# Patient Record
Sex: Male | Born: 1985 | Race: White | Hispanic: No | Marital: Single | Smoking: Current every day smoker
Health system: Southern US, Community
[De-identification: ages and names within clinical notes are randomized; demographics above are authoritative.]

## PROBLEM LIST (undated history)

## (undated) DIAGNOSIS — I10 Essential (primary) hypertension: Secondary | ICD-10-CM

---
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
## 2011-09-19 ENCOUNTER — Emergency Department: Payer: Self-pay | Admitting: Unknown Physician Specialty

## 2011-09-19 LAB — CBC
HCT: 37.9 % — ABNORMAL LOW (ref 40.0–52.0)
HGB: 13 g/dL (ref 13.0–18.0)
MCH: 30.3 pg (ref 26.0–34.0)
MCV: 88 fL (ref 80–100)
Platelet: 233 10*3/uL (ref 150–440)
RBC: 4.3 10*6/uL — ABNORMAL LOW (ref 4.40–5.90)

## 2011-09-19 LAB — ETHANOL
Ethanol %: <0.003 % (ref 0.000–0.080)
Ethanol: <3 mg/dL

## 2011-09-19 LAB — COMPREHENSIVE METABOLIC PANEL
Albumin: 3.3 g/dL — ABNORMAL LOW (ref 3.4–5.0)
Bilirubin,Total: 0.3 mg/dL (ref 0.2–1.0)
Chloride: 109 mmol/L — ABNORMAL HIGH (ref 98–107)
EGFR (Non-African Amer.): >60
Glucose: 102 mg/dL — ABNORMAL HIGH (ref 65–99)
Osmolality: 284 (ref 275–301)
Potassium: 3.6 mmol/L (ref 3.5–5.1)
SGOT(AST): 28 U/L (ref 15–37)
SGPT (ALT): 25 U/L
Sodium: 142 mmol/L (ref 136–145)
Total Protein: 6 g/dL — ABNORMAL LOW (ref 6.4–8.2)

## 2011-09-20 LAB — URINALYSIS, COMPLETE
Bacteria: NONE SEEN
Bilirubin,UR: NEGATIVE
Blood: NEGATIVE
Glucose,UR: 50 mg/dL (ref 0–75)
Ketone: NEGATIVE
Leukocyte Esterase: NEGATIVE
RBC,UR: NONE SEEN /HPF (ref 0–5)
Specific Gravity: 1.011 (ref 1.003–1.030)
Squamous Epithelial: <1

## 2011-09-20 LAB — DRUG SCREEN, URINE
Barbiturates, Ur Screen: NEGATIVE (ref ?–200)
Benzodiazepine, Ur Scrn: NEGATIVE (ref ?–200)
Cannabinoid 50 Ng, Ur ~~LOC~~: POSITIVE (ref ?–50)
Methadone, Ur Screen: NEGATIVE (ref ?–300)

## 2011-09-28 ENCOUNTER — Emergency Department: Payer: Self-pay | Admitting: Emergency Medicine

## 2011-12-10 ENCOUNTER — Emergency Department: Payer: Self-pay | Admitting: Emergency Medicine

## 2012-03-12 ENCOUNTER — Emergency Department: Payer: Self-pay | Admitting: Emergency Medicine

## 2013-05-23 ENCOUNTER — Emergency Department: Payer: Self-pay | Admitting: Emergency Medicine
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
## 2013-09-02 ENCOUNTER — Emergency Department: Payer: Self-pay | Admitting: Emergency Medicine

## 2013-09-02 LAB — COMPREHENSIVE METABOLIC PANEL
ALBUMIN: 4.6 g/dL (ref 3.4–5.0)
Alkaline Phosphatase: 98 U/L
Anion Gap: 12 (ref 7–16)
BUN: 16 mg/dL (ref 7–18)
Bilirubin,Total: 1.5 mg/dL — ABNORMAL HIGH (ref 0.2–1.0)
Calcium, Total: 9.7 mg/dL (ref 8.5–10.1)
Chloride: 103 mmol/L (ref 98–107)
Co2: 22 mmol/L (ref 21–32)
Creatinine: 1.23 mg/dL (ref 0.60–1.30)
EGFR (Non-African Amer.): >60
GLUCOSE: 125 mg/dL — AB (ref 65–99)
Osmolality: 276 (ref 275–301)
Potassium: 3.1 mmol/L — ABNORMAL LOW (ref 3.5–5.1)
SGOT(AST): 29 U/L (ref 15–37)
SGPT (ALT): 22 U/L (ref 12–78)
Sodium: 137 mmol/L (ref 136–145)
TOTAL PROTEIN: 8.3 g/dL — AB (ref 6.4–8.2)

## 2013-09-02 LAB — CBC
HCT: 48.6 % (ref 40.0–52.0)
HGB: 16.7 g/dL (ref 13.0–18.0)
MCH: 29.8 pg (ref 26.0–34.0)
MCHC: 34.4 g/dL (ref 32.0–36.0)
MCV: 87 fL (ref 80–100)
Platelet: 226 10*3/uL (ref 150–440)
RBC: 5.6 10*6/uL (ref 4.40–5.90)
RDW: 13.3 % (ref 11.5–14.5)
WBC: 10 10*3/uL (ref 3.8–10.6)

## 2013-09-02 LAB — LIPASE, BLOOD: Lipase: 192 U/L (ref 73–393)

## 2013-11-01 ENCOUNTER — Emergency Department: Payer: Self-pay | Admitting: Emergency Medicine

## 2015-11-02 ENCOUNTER — Emergency Department: Payer: Self-pay

## 2015-11-02 ENCOUNTER — Encounter: Payer: Self-pay | Admitting: *Deleted

## 2015-11-02 ENCOUNTER — Emergency Department
Admission: EM | Admit: 2015-11-02 | Discharge: 2015-11-02 | Disposition: A | Discharge status: Home / Self Care | Payer: Self-pay | Attending: Emergency Medicine | Admitting: Emergency Medicine

## 2015-11-02 DIAGNOSIS — R112 Nausea with vomiting, unspecified: Secondary | ICD-10-CM | POA: Insufficient documentation

## 2015-11-02 DIAGNOSIS — R1111 Vomiting without nausea: Secondary | ICD-10-CM

## 2015-11-02 DIAGNOSIS — F17210 Nicotine dependence, cigarettes, uncomplicated: Secondary | ICD-10-CM | POA: Insufficient documentation

## 2015-11-02 LAB — CBC
HEMATOCRIT: 49.9 % (ref 40.0–52.0)
HEMOGLOBIN: 18 g/dL (ref 13.0–18.0)
MCH: 30.4 pg (ref 26.0–34.0)
MCHC: 36.1 g/dL — ABNORMAL HIGH (ref 32.0–36.0)
MCV: 84.1 fL (ref 80.0–100.0)
Platelets: 211 10*3/uL (ref 150–440)
RBC: 5.93 MIL/uL — AB (ref 4.40–5.90)
RDW: 12.8 % (ref 11.5–14.5)
WBC: 7 10*3/uL (ref 3.8–10.6)

## 2015-11-02 LAB — COMPREHENSIVE METABOLIC PANEL
ALK PHOS: 85 U/L (ref 38–126)
ALT: 18 U/L (ref 17–63)
AST: 20 U/L (ref 15–41)
Albumin: 5 g/dL (ref 3.5–5.0)
Anion gap: 15 (ref 5–15)
BILIRUBIN TOTAL: 2 mg/dL — AB (ref 0.3–1.2)
BUN: 22 mg/dL — ABNORMAL HIGH (ref 6–20)
CALCIUM: 9.6 mg/dL (ref 8.9–10.3)
CO2: 22 mmol/L (ref 22–32)
CREATININE: 0.93 mg/dL (ref 0.61–1.24)
Chloride: 95 mmol/L — ABNORMAL LOW (ref 101–111)
Glucose, Bld: 88 mg/dL (ref 65–99)
Potassium: 3.3 mmol/L — ABNORMAL LOW (ref 3.5–5.1)
SODIUM: 132 mmol/L — AB (ref 135–145)
Total Protein: 8.1 g/dL (ref 6.5–8.1)

## 2015-11-02 LAB — URINALYSIS COMPLETE WITH MICROSCOPIC (ARMC ONLY)
Bilirubin Urine: NEGATIVE
GLUCOSE, UA: NEGATIVE mg/dL
Hgb urine dipstick: NEGATIVE
Leukocytes, UA: NEGATIVE
Nitrite: NEGATIVE
Protein, ur: 100 mg/dL — AB
SPECIFIC GRAVITY, URINE: 1.028 (ref 1.005–1.030)
Squamous Epithelial / LPF: NONE SEEN
pH: 5 (ref 5.0–8.0)

## 2015-11-02 LAB — LIPASE, BLOOD: Lipase: 25 U/L (ref 11–51)

## 2015-11-02 MED ORDER — PROMETHAZINE HCL 25 MG PO TABS: 25.0000 mg | ORAL_TABLET | Freq: Four times a day (QID) | ORAL | 0 refills | Status: DC | PRN

## 2015-11-02 MED ORDER — POLYETHYLENE GLYCOL 3350 17 GM/SCOOP PO POWD: 17.0000 g | Freq: Every day | ORAL | 0 refills | Status: DC

## 2015-11-02 MED ORDER — KETOROLAC TROMETHAMINE 30 MG/ML IJ SOLN
30.0000 mg | Freq: Once | INTRAMUSCULAR | Status: AC
  Administered 2015-11-02: 30 mg via INTRAVENOUS
  Filled 2015-11-02: qty 1

## 2015-11-02 MED ORDER — ONDANSETRON 4 MG PO TBDP: 4.0000 mg | ORAL_TABLET | Freq: Three times a day (TID) | ORAL | 0 refills | Status: DC | PRN

## 2015-11-02 MED ORDER — SODIUM CHLORIDE 0.9 % IV BOLUS (SEPSIS)
1000.0000 mL | Freq: Once | INTRAVENOUS | Status: AC
  Administered 2015-11-02: 1000 mL via INTRAVENOUS

## 2015-11-02 MED ORDER — ONDANSETRON HCL 4 MG/2ML IJ SOLN
4.0000 mg | Freq: Once | INTRAMUSCULAR | Status: AC
  Administered 2015-11-02: 4 mg via INTRAVENOUS
  Filled 2015-11-02: qty 2

## 2015-11-02 NOTE — ED Triage Notes (Signed)
Pt reports nausea/vomitng since Tuesday, pt reports lower abdominal pain, pt denies fever

## 2015-11-02 NOTE — ED Notes (Signed)
Pt verbalized understanding of discharge instructions. NAD at this time. 

## 2015-11-02 NOTE — ED Provider Notes (Signed)
[facility] Emergency Department Provider Note  Time seen: 8:03 AM  I have reviewed the triage vital signs and the nursing notes.   HISTORY  Chief Complaint Nausea and Emesis    HPI Oscar Miranda is a 30 y.o. male with no past medical history who presents the emergency department with nausea, vomiting and left-sided abdominal discomfort. According to the patient for the past 5 days he has felt nauseated with occasional episodes of vomiting. States he has had constipation as well for for 5 days. Denies fever. States he has had episodes of nausea and vomiting in the past but never lasting this [place]. Describes his nausea is moderate, abdominal pain as mild located mostly in the epigastrium to left side. States he abdominal pain is worse when vomiting.  History reviewed. No pertinent past medical history.  There are no active problems to display for this patient.   History reviewed. No pertinent surgical history.  Prior to Admission medications   Not on File    No Known Allergies  No family history on file.  Social History Social History  Substance Use Topics  . Smoking status: Current Every Day Smoker    Packs/day: 0.50    Types: Cigarettes  . Smokeless tobacco: Never Used  . Alcohol use No    Review of Systems Constitutional: Negative for fever. Cardiovascular: Negative for chest pain. Respiratory: Negative for shortness of breath. Gastrointestinal: Epigastric to left-sided abdominal discomfort. Positive for nausea or vomiting. Positive for constipation. Genitourinary: Negative for dysuria. Musculoskeletal: Mild lower back pain. Neurological: Negative for headache 10-point ROS otherwise negative.  ____________________________________________   PHYSICAL EXAM:  VITAL SIGNS: ED Triage Vitals  Enc Vitals Group     BP 11/02/15 0758 (!) 169/115     Pulse Rate 11/02/15 0758 85     Resp 11/02/15 0758 18     Temp 11/02/15 0758 99.1 F (37.3  C)     Temp Source 11/02/15 0758 Oral     SpO2 11/02/15 0758 100 %     Weight 11/02/15 0753 125 lb (56.7 kg)     Height 11/02/15 0753 5\' 2"  (1.575 m)     Head Circumference --      Peak Flow --      Pain Score 11/02/15 0753 5     Pain Loc --      Pain Edu? --      Excl. in GC? --     Constitutional: Alert and oriented. Well appearing and in no distress. Eyes: Normal exam ENT   Head: Normocephalic and atraumatic.   Mouth/Throat: Mucous membranes are moist. Cardiovascular: Normal rate, regular rhythm. No murmur Respiratory: Normal respiratory effort without tachypnea nor retractions. Breath sounds are clear Gastrointestinal: Soft, mild epigastric tenderness palpation, mild left sided and left lower quadrant tenderness palpation. No rebound or guarding. No right-sided tenderness. Musculoskeletal: Nontender with normal range of motion in all extremities.  Neurologic:  Normal speech and language. No gross focal neurologic deficits are appreciated. Skin:  Skin is warm, dry and intact.  Psychiatric: Mood and affect are normal. Speech and behavior are normal.   ____________________________________________     RADIOLOGY  X-ray shows moderate stool Azucena CecilBurton, otherwise within normal limits.  ____________________________________________   INITIAL IMPRESSION / ASSESSMENT AND PLAN / ED COURSE  Pertinent labs & imaging results that were available during my care of the patient were reviewed by me and considered in my medical decision making (see chart for details).  The patient presents the  emergency department with abdominal discomfort nausea and vomiting. Overall the patient appears well, mild abdominal tenderness on exam. We will check labs, IV hydrate treat discomfort nausea while awaiting lab results. We will also obtain a three-way abdominal x-ray.  X-ray shows moderate stool Azucena Cecil, otherwise normal. Labs are largely within normal limits besides ketones and the patient's  urine. We'll discharge with MiraLAX for constipation, and Zofran as needed for nausea. The patient is agreeable plan. Has tolerated drink in the emergency department without issue.  ____________________________________________   FINAL CLINICAL IMPRESSION(S) / ED DIAGNOSES  Vomiting  constipation   Minna Antis, MD 11/02/15 1151

## 2015-11-02 NOTE — ED Notes (Signed)
Patient transported to X-ray 

## 2015-11-05 MED ORDER — MORPHINE SULFATE (PF) 4 MG/ML IV SOLN
INTRAVENOUS | Status: AC
  Filled 2015-11-05: qty 1

## 2016-01-23 ENCOUNTER — Encounter: Payer: Self-pay | Admitting: Emergency Medicine

## 2016-01-23 ENCOUNTER — Emergency Department
Admission: EM | Admit: 2016-01-23 | Discharge: 2016-01-23 | Disposition: A | Discharge status: Home / Self Care | Payer: Self-pay | Attending: Emergency Medicine | Admitting: Emergency Medicine

## 2016-01-23 DIAGNOSIS — Z79899 Other long term (current) drug therapy: Secondary | ICD-10-CM | POA: Insufficient documentation

## 2016-01-23 DIAGNOSIS — L739 Follicular disorder, unspecified: Secondary | ICD-10-CM | POA: Insufficient documentation

## 2016-01-23 DIAGNOSIS — F17210 Nicotine dependence, cigarettes, uncomplicated: Secondary | ICD-10-CM | POA: Insufficient documentation

## 2016-01-23 MED ORDER — CEPHALEXIN 500 MG PO CAPS: 500.0000 mg | ORAL_CAPSULE | Freq: Three times a day (TID) | ORAL | 0 refills | Status: DC

## 2016-01-23 NOTE — Discharge Instructions (Signed)
Take the antibiotics as directed. Keep the areas clean with surgical soap. Cover with OTC bacitracin antibiotic ointment. [facility]ck as needed.

## 2016-01-23 NOTE — ED Notes (Signed)
Multiple red and raised bumps to both areas of new tattoos. Right arm and right lower extremity. Pt got these tattoos approx 2 week ago. Denise pain. Pt alert and oriented X4, active, cooperative, pt in NAD. RR even and unlabored, color WNL.

## 2016-01-23 NOTE — ED Provider Notes (Signed)
Oscar Miranda ____________________________________________  Time seen: 2127  I have reviewed the triage vital signs and the nursing notes.  HISTORY  Chief Complaint  Rash  HPI Oscar Miranda is a 30 y.o. male sensitivity ED for evaluation of bumps on around his 2 recent tattoos. He describes that he received a tattoo to his chest but a week agoand that tattoo on his ankle about 2 days ago. Since that time he has had some small blisters and white has developed over the area. He has attempted to apply Neosporin to the areas without significant benefit. He denies any fevers, chills, sweats. He notes some small pustules to the tattoo on his ankle.  History reviewed. No pertinent past medical history.  There are no active problems to display for this patient.  History reviewed. No pertinent surgical history.  Prior to Admission medications   Medication Sig Start Date End Date Taking? Authorizing Provider  cephALEXin (KEFLEX) 500 MG capsule Take 1 capsule (500 mg total) by mouth 3 (three) times daily. 01/23/16   Posey Jasmin V Bacon Adison Reifsteck, PA-C  ondansetron (ZOFRAN ODT) 4 MG disintegrating tablet Take 1 tablet (4 mg total) by mouth every 8 (eight) hours as needed for nausea or vomiting. 11/02/15   Minna AntisKevin Paduchowski, MD  polyethylene glycol powder (GLYCOLAX/MIRALAX) powder Take 17 g by mouth daily. 11/02/15   Minna AntisKevin Paduchowski, MD  polyethylene glycol powder (GLYCOLAX/MIRALAX) powder Take 17 g by mouth daily. 11/02/15   Minna AntisKevin Paduchowski, MD  promethazine (PHENERGAN) 25 MG tablet Take 1 tablet (25 mg total) by mouth every 6 (six) hours as needed for nausea or vomiting. 11/02/15   Minna AntisKevin Paduchowski, MD    Allergies Patient has no known allergies.  No family history on file.  Social History Social History  Substance Use Topics  . Smoking status: Current Every Day Smoker    Packs/day: 0.50    Types: Cigarettes  . Smokeless tobacco: Never  Used  . Alcohol use Yes   Review of Systems  Constitutional: Negative for fever. Cardiovascular: Negative for chest pain. Respiratory: Negative for shortness of breath. Musculoskeletal: Negative for back pain. Skin: Positive for rash to the right chest and medial right ankle Neurological: Negative for headaches, focal weakness or numbness. ____________________________________________  PHYSICAL EXAM:  VITAL SIGNS: ED Triage Vitals  Enc Vitals Group     BP 01/23/16 2013 (!) 155/91     Pulse Rate 01/23/16 2013 67     Resp 01/23/16 2013 14     Temp 01/23/16 2013 98.3 F (36.8 C)     Temp Source 01/23/16 2013 Oral     SpO2 01/23/16 2013 98 %     Weight 01/23/16 2014 127 lb (57.6 kg)     Height 01/23/16 2014 5\' 2"  (1.575 m)     Head Circumference --      Peak Flow --      Pain Score 01/23/16 2016 0     Pain Loc --      Pain Edu? --      Excl. in GC? --    Constitutional: Alert and oriented. Well appearing and in no distress. Head: Normocephalic and atraumatic. Cardiovascular: Normal rate, regular rhythm. Normal distal pulses. Respiratory: Normal respiratory effort. Musculoskeletal: Nontender with normal range of motion in all extremities.  Neurologic:  Normal gait without ataxia. Normal speech and language. No gross focal neurologic deficits are appreciated. Skin:  Skin is warm, dry and intact. Patient with small collection of scattered erythematous  papules over and around the left upper chest. There is no weeping, drainage, or honey colored crust about the tattoo. Psychiatric: Mood and affect are normal. Patient exhibits appropriate insight and judgment. ____________________________________________  INITIAL IMPRESSION / ASSESSMENT AND PLAN / ED COURSE  Patient with clinical presentation that may represent local folliculitis overlying his recent tattoos. He is discharged with prescriptions for Keflex to dose as directed. He is advised to apply topical bacitracin to the areas  as well. He will use for chlorhexidine surgical wash-daily to cleanse the area. He will follow with one of the local community clinics for ongoing symptom management. Return precautions are reviewed.  Clinical Course    ____________________________________________  FINAL CLINICAL IMPRESSION(S) / ED DIAGNOSES  Final diagnoses:  Folliculitis      Lissa HoardJenise V Bacon Karol Liendo, PA-C 01/27/16 1835    Myrna Blazeravid Matthew Schaevitz, MD 01/29/16 [phone]

## 2016-01-23 NOTE — ED Notes (Signed)
Pt alert and oriented X4, active, cooperative, pt in NAD. RR even and unlabored, color WNL.  Pt informed to return if any life threatening symptoms occur.   

## 2016-01-23 NOTE — ED Triage Notes (Signed)
Pt states that he got two new tattoos around 2 weeks ago and that about a week ago he experienced a rash. Pt states that the " dude who gave me the tattoo told me it was tattoo acne". Pt denies any pain associated with rash and denies any contact poison ivy or other rash causing agents. Pt is in NAD at this time.

## 2016-05-12 DIAGNOSIS — R109 Unspecified abdominal pain: Secondary | ICD-10-CM | POA: Insufficient documentation

## 2016-05-12 DIAGNOSIS — F17210 Nicotine dependence, cigarettes, uncomplicated: Secondary | ICD-10-CM | POA: Insufficient documentation

## 2016-05-12 LAB — COMPREHENSIVE METABOLIC PANEL
ALT: 17 U/L (ref 17–63)
ANION GAP: 14 (ref 5–15)
AST: 23 U/L (ref 15–41)
Albumin: 5.4 g/dL — ABNORMAL HIGH (ref 3.5–5.0)
Alkaline Phosphatase: 90 U/L (ref 38–126)
BUN: 19 mg/dL (ref 6–20)
CO2: 19 mmol/L — ABNORMAL LOW (ref 22–32)
Calcium: 10 mg/dL (ref 8.9–10.3)
Chloride: 104 mmol/L (ref 101–111)
Creatinine, Ser: 1.07 mg/dL (ref 0.61–1.24)
Glucose, Bld: 122 mg/dL — ABNORMAL HIGH (ref 65–99)
POTASSIUM: 3.1 mmol/L — AB (ref 3.5–5.1)
Sodium: 137 mmol/L (ref 135–145)
Total Bilirubin: 1.6 mg/dL — ABNORMAL HIGH (ref 0.3–1.2)
Total Protein: 8.9 g/dL — ABNORMAL HIGH (ref 6.5–8.1)

## 2016-05-12 LAB — CBC
HCT: 49.9 % (ref 40.0–52.0)
Hemoglobin: 17.5 g/dL (ref 13.0–18.0)
MCH: 30 pg (ref 26.0–34.0)
MCHC: 35.1 g/dL (ref 32.0–36.0)
MCV: 85.5 fL (ref 80.0–100.0)
Platelets: 279 10*3/uL (ref 150–440)
RBC: 5.84 MIL/uL (ref 4.40–5.90)
RDW: 13.4 % (ref 11.5–14.5)
WBC: 9.9 10*3/uL (ref 3.8–10.6)

## 2016-05-12 NOTE — ED Triage Notes (Signed)
Pt with co left flank pain and and vomiting since yest, has been here for the same and told different causes.  Denies any diarrhea or dysuria.

## 2016-05-13 ENCOUNTER — Emergency Department
Admission: EM | Admit: 2016-05-13 | Discharge: 2016-05-13 | Disposition: A | Discharge status: Home / Self Care | Payer: Self-pay | Attending: Emergency Medicine | Admitting: Emergency Medicine

## 2016-05-13 ENCOUNTER — Emergency Department: Payer: Self-pay

## 2016-05-13 DIAGNOSIS — R109 Unspecified abdominal pain: Secondary | ICD-10-CM

## 2016-05-13 MED ORDER — KETOROLAC TROMETHAMINE 30 MG/ML IJ SOLN
30.0000 mg | Freq: Once | INTRAMUSCULAR | Status: AC
  Administered 2016-05-13: 30 mg via INTRAVENOUS

## 2016-05-13 MED ORDER — KETOROLAC TROMETHAMINE 30 MG/ML IJ SOLN
INTRAMUSCULAR | Status: AC
  Administered 2016-05-13: 30 mg via INTRAVENOUS
  Filled 2016-05-13: qty 1

## 2016-05-13 MED ORDER — KETOROLAC TROMETHAMINE 10 MG PO TABS: 10.0000 mg | ORAL_TABLET | Freq: Four times a day (QID) | ORAL | 0 refills | Status: DC | PRN

## 2016-05-13 MED ORDER — ONDANSETRON HCL 4 MG/2ML IJ SOLN
4.0000 mg | Freq: Once | INTRAMUSCULAR | Status: AC
  Administered 2016-05-13: 4 mg via INTRAVENOUS

## 2016-05-13 MED ORDER — SODIUM CHLORIDE 0.9 % IV BOLUS (SEPSIS)
1000.0000 mL | Freq: Once | INTRAVENOUS | Status: AC
  Administered 2016-05-13: 1000 mL via INTRAVENOUS

## 2016-05-13 MED ORDER — ONDANSETRON HCL 4 MG/2ML IJ SOLN
INTRAMUSCULAR | Status: AC
  Administered 2016-05-13: 4 mg via INTRAVENOUS
  Filled 2016-05-13: qty 2

## 2016-05-13 NOTE — ED Notes (Signed)
Pt taken to CT via stretcher.

## 2016-05-13 NOTE — ED Notes (Signed)
Pt given water 

## 2016-05-13 NOTE — ED Notes (Signed)
States nausea and vomiting since yest. Reports not being able to keep anything down. Pt states "knot on my left side, that's when I decided to come here." Denies hx of kidney stone, denies seeing blood in urine previously. Pt appears very sleepy. Grandma at bedside.

## 2016-05-13 NOTE — ED Notes (Signed)
Pt returned from CT via stretcher.

## 2016-05-14 NOTE — ED Provider Notes (Signed)
[facility] Emergency Department Provider Note   First MD Initiated Contact with Patient 05/13/16 [phone]     (approximate)  I have reviewed the triage vital signs and the nursing notes.   HISTORY  Chief Complaint Emesis    HPI Oscar Miranda is a 31 y.o. male presents with left flank pain and vomiting times one day. Patient states that his experiences multiple times in the past and has been evaluated for however did does not recall what he was diagnosed with. Patient states current pain score is 7 out of 10. Patient denies any fever no dysuria no hematuria.   Past medical history No pertinent past medical history There are no active problems to display for this patient.   Past surgical history None Prior to Admission medications   Medication Sig Start Date End Date Taking? Authorizing Provider  cephALEXin (KEFLEX) 500 MG capsule Take 1 capsule (500 mg total) by mouth 3 (three) times daily. 01/23/16   Jenise V Bacon Menshew, PA-C  ketorolac (TORADOL) 10 MG tablet Take 1 tablet (10 mg total) by mouth every 6 (six) hours as needed. 05/13/16   Darci Current, MD  ondansetron (ZOFRAN ODT) 4 MG disintegrating tablet Take 1 tablet (4 mg total) by mouth every 8 (eight) hours as needed for nausea or vomiting. 11/02/15   Minna Antis, MD  polyethylene glycol powder (GLYCOLAX/MIRALAX) powder Take 17 g by mouth daily. 11/02/15   Minna Antis, MD  polyethylene glycol powder (GLYCOLAX/MIRALAX) powder Take 17 g by mouth daily. 11/02/15   Minna Antis, MD  promethazine (PHENERGAN) 25 MG tablet Take 1 tablet (25 mg total) by mouth every 6 (six) hours as needed for nausea or vomiting. 11/02/15   Minna Antis, MD    Allergies Patient has no known allergies.  No family history on file.  Social History Social History  Substance Use Topics  . Smoking status: Current Every Day Smoker    Packs/day: 0.50    Types: Cigarettes  . Smokeless tobacco: Never Used   . Alcohol use Yes    Review of Systems Constitutional: No fever/chills Eyes: No visual changes. ENT: No sore throat. Cardiovascular: Denies chest pain. Respiratory: Denies shortness of breath. Gastrointestinal: No abdominal pain.  No nausea, no vomiting.  No diarrhea.  No constipation. Positive for left flank pain Genitourinary: Negative for dysuria. Musculoskeletal: Negative for back pain. Skin: Negative for rash. Neurological: Negative for headaches, focal weakness or numbness.  10-point ROS otherwise negative.  ____________________________________________   PHYSICAL EXAM:  VITAL SIGNS: ED Triage Vitals  Enc Vitals Group     BP 05/12/16 2223 (!) 176/108     Pulse Rate 05/12/16 2221 64     Resp 05/12/16 2221 20     Temp 05/12/16 2221 99.5 F (37.5 C)     Temp Source 05/12/16 2221 Oral     SpO2 05/12/16 2221 97 %     Weight 05/12/16 2223 125 lb (56.7 kg)     Height 05/12/16 2223 5\' 2"  (1.575 m)     Head Circumference --      Peak Flow --      Pain Score 05/12/16 2226 7     Pain Loc --      Pain Edu? --      Excl. in GC? --     Constitutional: Alert and oriented. Well appearing and in no acute distress. Eyes: Conjunctivae are normal. PERRL. EOMI. Head: Atraumatic. Mouth/Throat: Mucous membranes are moist. Oropharynx non-erythematous. Neck: No  stridor.   Cardiovascular: Normal rate, regular rhythm. Good peripheral circulation. Grossly normal heart sounds. Respiratory: Normal respiratory effort.  No retractions. Lungs CTAB. Gastrointestinal: Soft and nontender. No distention.  Musculoskeletal: No lower extremity tenderness nor edema. No gross deformities of extremities. Neurologic:  Normal speech and language. No gross focal neurologic deficits are appreciated.  Skin:  Skin is warm, dry and intact. No rash noted. Psychiatric: Mood and affect are normal. Speech and behavior are normal.  ____________________________________________   LABS (all labs ordered are  listed, but only abnormal results are displayed)  Labs Reviewed  COMPREHENSIVE METABOLIC PANEL - Abnormal; Notable for the following:       Result Value   Potassium 3.1 (*)    CO2 19 (*)    Glucose, Bld 122 (*)    Total Protein 8.9 (*)    Albumin 5.4 (*)    Total Bilirubin 1.6 (*)    All other components within normal limits  CBC     CLINICAL DATA:  31 year old male with left flank pain.  EXAM: CT ABDOMEN AND PELVIS WITHOUT CONTRAST  TECHNIQUE: Multidetector CT imaging of the abdomen and pelvis was performed following the standard protocol without IV contrast.  COMPARISON:  Abdominal radiograph dated 11/02/2015  FINDINGS: Evaluation of this exam is limited in the absence of intravenous contrast.  Lower chest: There is a 5 mm nodule along the right major fissure (series 4, image 3). The visualized lung bases are otherwise clear.  No intra-abdominal free air or free fluid.  Hepatobiliary: No focal liver abnormality is seen. No gallstones, gallbladder wall thickening, or biliary dilatation.  Pancreas: Unremarkable. No pancreatic ductal dilatation or surrounding inflammatory changes.  Spleen: Normal in size without focal abnormality.  Adrenals/Urinary Tract: Adrenal glands are unremarkable. Kidneys are normal, without renal calculi, focal lesion, or hydronephrosis. Bladder is unremarkable.  Stomach/Bowel: Stomach is within normal limits. Appendix appears normal. No evidence of bowel wall thickening, distention, or inflammatory changes.  Vascular/Lymphatic: The abdominal aorta and IVC appear grossly unremarkable on this noncontrast study. No portal venous gas identified. There is no adenopathy.  Reproductive: The prostate and seminal vesicles are grossly unremarkable.  Other: None  Musculoskeletal: No acute or significant osseous findings.  IMPRESSION: 1. No acute intra-abdominal or pelvic pathology. Specifically there is no hydronephrosis  or nephrolithiasis. 2. No evidence of bowel obstruction or active inflammation. Normal appendix. 3. A 5 mm right lower lobe subpleural nodule.   Electronically Signed   By: Elgie CollardArash  Radparvar M.D.   On: 05/13/2016 01:52   Vitals     Procedures      INITIAL IMPRESSION / ASSESSMENT AND PLAN / ED COURSE  Pertinent labs & imaging results that were available during my care of the patient were reviewed by me and considered in my medical decision making (see chart for details).   No clear etiology for the patient's left flank pain identified.     ____________________________________________  FINAL CLINICAL IMPRESSION(S) / ED DIAGNOSES  Final diagnoses:  Left flank pain     MEDICATIONS GIVEN DURING THIS VISIT:  Medications  sodium chloride 0.9 % bolus 1,000 mL (0 mLs Intravenous Stopped 05/13/16 0246)  ketorolac (TORADOL) 30 MG/ML injection 30 mg (30 mg Intravenous Given 05/13/16 0054)  ondansetron (ZOFRAN) injection 4 mg (4 mg Intravenous Given 05/13/16 0054)     NEW OUTPATIENT MEDICATIONS STARTED DURING THIS VISIT:  Discharge Medication List as of 05/13/2016  2:43 AM    START taking these medications   Details  ketorolac (TORADOL)  10 MG tablet Take 1 tablet (10 mg total) by mouth every 6 (six) hours as needed., Starting Thu 05/13/2016, Print        Discharge Medication List as of 05/13/2016  2:43 AM      Discharge Medication List as of 05/13/2016  2:43 AM       Note:  This document was prepared using Dragon voice recognition software and may include unintentional dictation errors.    Darci Current, MD 05/14/16 [phone]

## 2016-10-10 ENCOUNTER — Encounter: Payer: Self-pay | Admitting: Emergency Medicine

## 2016-10-10 ENCOUNTER — Emergency Department: Payer: Self-pay

## 2016-10-10 ENCOUNTER — Emergency Department
Admission: EM | Admit: 2016-10-10 | Discharge: 2016-10-10 | Disposition: A | Discharge status: Home / Self Care | Payer: Self-pay | Attending: Emergency Medicine | Admitting: Emergency Medicine

## 2016-10-10 DIAGNOSIS — Y93H3 Activity, building and construction: Secondary | ICD-10-CM | POA: Insufficient documentation

## 2016-10-10 DIAGNOSIS — S20221A Contusion of right back wall of thorax, initial encounter: Secondary | ICD-10-CM | POA: Insufficient documentation

## 2016-10-10 DIAGNOSIS — Y998 Other external cause status: Secondary | ICD-10-CM | POA: Insufficient documentation

## 2016-10-10 DIAGNOSIS — S20211A Contusion of right front wall of thorax, initial encounter: Secondary | ICD-10-CM

## 2016-10-10 DIAGNOSIS — F17210 Nicotine dependence, cigarettes, uncomplicated: Secondary | ICD-10-CM | POA: Insufficient documentation

## 2016-10-10 DIAGNOSIS — W01198A Fall on same level from slipping, tripping and stumbling with subsequent striking against other object, initial encounter: Secondary | ICD-10-CM | POA: Insufficient documentation

## 2016-10-10 DIAGNOSIS — Z79899 Other long term (current) drug therapy: Secondary | ICD-10-CM | POA: Insufficient documentation

## 2016-10-10 DIAGNOSIS — Y92018 Other place in single-family (private) house as the place of occurrence of the external cause: Secondary | ICD-10-CM | POA: Insufficient documentation

## 2016-10-10 MED ORDER — NAPROXEN 500 MG PO TABS: 500.0000 mg | ORAL_TABLET | Freq: Two times a day (BID) | ORAL | 0 refills | Status: AC

## 2016-10-10 MED ORDER — CYCLOBENZAPRINE HCL 5 MG PO TABS: 5.0000 mg | ORAL_TABLET | Freq: Three times a day (TID) | ORAL | 0 refills | Status: DC | PRN

## 2016-10-10 MED ORDER — NAPROXEN 500 MG PO TABS
500.0000 mg | ORAL_TABLET | Freq: Once | ORAL | Status: AC
  Administered 2016-10-10: 500 mg via ORAL
  Filled 2016-10-10: qty 1

## 2016-10-10 MED ORDER — CYCLOBENZAPRINE HCL 10 MG PO TABS
5.0000 mg | ORAL_TABLET | Freq: Once | ORAL | Status: AC
  Administered 2016-10-10: 5 mg via ORAL
  Filled 2016-10-10: qty 1

## 2016-10-10 NOTE — Discharge Instructions (Signed)
Your exam and x-ray are negative for any fracture or dislocation. Take the prescription meds as directed. Apply ice or moist heat compresses to reduce pain. Follow[facility] with [facility] for continued symptoms.

## 2016-10-10 NOTE — ED Triage Notes (Signed)
Pt reports upped right back pain since yesterday thinks he might pulled a muscle, pt sleepy while in triage reports took aspirin in the morning for pain

## 2016-10-10 NOTE — ED Provider Notes (Signed)
[facility] Emergency Department Provider Note ____________________________________________  Time seen: 1223  I have reviewed the triage vital signs and the nursing notes.  HISTORY  Chief Complaint  Back Pain  HPI Oscar Miranda is a 31 y.o. male presents to the ED for evaluation of right-sided chest wall and rib pain. He describes lying on the the floor of the attic, trying to repair a hole. He reports nearly slipping through the joists, when he injured his right chest. He denies any shortness of breath, syncope, or hemoptysis. He notes pain increased with reaching and tuning. He has dosed aspirin, intermittently, for pain relief.   History reviewed. No pertinent past medical history.  There are no active problems to display for this patient.  History reviewed. No pertinent surgical history.  Prior to Admission medications   Medication Sig Start Date End Date Taking? Authorizing Provider  cephALEXin (KEFLEX) 500 MG capsule Take 1 capsule (500 mg total) by mouth 3 (three) times daily. 01/23/16   Press Casale, Charlesetta Ivory, PA-C  cyclobenzaprine (FLEXERIL) 5 MG tablet Take 1 tablet (5 mg total) by mouth 3 (three) times daily as needed for muscle spasms. 10/10/16   Ioana Louks, Charlesetta Ivory, PA-C  ketorolac (TORADOL) 10 MG tablet Take 1 tablet (10 mg total) by mouth every 6 (six) hours as needed. 05/13/16   Darci Current, MD  naproxen (NAPROSYN) 500 MG tablet Take 1 tablet (500 mg total) by mouth 2 (two) times daily with a meal. 10/10/16 11/09/16  Nicosha Struve, Charlesetta Ivory, PA-C  ondansetron (ZOFRAN ODT) 4 MG disintegrating tablet Take 1 tablet (4 mg total) by mouth every 8 (eight) hours as needed for nausea or vomiting. 11/02/15   Minna Antis, MD  polyethylene glycol powder (GLYCOLAX/MIRALAX) powder Take 17 g by mouth daily. 11/02/15   Minna Antis, MD  polyethylene glycol powder (GLYCOLAX/MIRALAX) powder Take 17 g by mouth daily. 11/02/15   Minna Antis, MD   promethazine (PHENERGAN) 25 MG tablet Take 1 tablet (25 mg total) by mouth every 6 (six) hours as needed for nausea or vomiting. 11/02/15   Minna Antis, MD    Allergies Patient has no known allergies.  No family history on file.  Social History Social History  Substance Use Topics  . Smoking status: Current Every Day Smoker    Packs/day: 0.50    Types: Cigarettes  . Smokeless tobacco: Never Used  . Alcohol use Yes    Review of Systems  Constitutional: Negative for fever. Cardiovascular: Negative for chest pain. Respiratory: Negative for shortness of breath. Gastrointestinal: Negative for abdominal pain, vomiting and diarrhea. Genitourinary: Negative for dysuria. Musculoskeletal: Negative for back pain. Right chest wall/rib pain as above.  Skin: Negative for rash. Neurological: Negative for headaches, focal weakness or numbness. ____________________________________________  PHYSICAL EXAM:  VITAL SIGNS: ED Triage Vitals  Enc Vitals Group     BP 10/10/16 1145 140/89     Pulse Rate 10/10/16 1145 (!) 53     Resp 10/10/16 1145 20     Temp 10/10/16 1145 98.9 F (37.2 C)     Temp src --      SpO2 10/10/16 1145 100 %     Weight 10/10/16 1145 130 lb (59 kg)     Height 10/10/16 1145 5\' 3"  (1.6 m)     Head Circumference --      Peak Flow --      Pain Score 10/10/16 1144 7     Pain Loc --  Pain Edu? --      Excl. in GC? --     Constitutional: Alert and oriented. Well appearing and in no distress. Head: Normocephalic and atraumatic. Neck: Supple. No thyromegaly. Cardiovascular: Normal rate, regular rhythm. Normal distal pulses. Respiratory: Normal respiratory effort. No wheezes/rales/rhonchi. Gastrointestinal: Soft and nontender. No distention. Musculoskeletal: right chest wall without any obvious deformity, dislocation, or chest wall defect. No overlying ecchymosis, bruise, abrasion, erythema is noted.Nontender with normal range of motion in all extremities.   Neurologic:  Normal gait without ataxia. Normal speech and language. No gross focal neurologic deficits are appreciated. Skin:  Skin is warm, dry and intact. No rash noted. ____________________________________________   RADIOLOGY  Right Rib Detail  IMPRESSION: No acute findings. ____________________________________________  PROCEDURES  Naproxen 500 mg PO Flexeril 5 mg PO ____________________________________________  INITIAL IMPRESSION / ASSESSMENT AND PLAN / ED COURSE  Patient with a right chest wall contusion s/p a near fall while lying in a prone position. His x-rays negative for any acute fracture or dislocation. The patient likely is experiencing some chest wall pain secondary to contusion. He'll be discharged with a prescription for naproxen and cyclobenzaprine. He'll follow with primary provider as needed. ____________________________________________  FINAL CLINICAL IMPRESSION(S) / ED DIAGNOSES  Final diagnoses:  Chest wall contusion, right, initial encounter      Lissa HoardMenshew, Koleman Marling V Bacon, PA-C 10/10/16 1700    Governor RooksLord, Rebecca, MD 10/12/16 [phone]

## 2017-07-30 IMAGING — CR DG ABDOMEN ACUTE W/ 1V CHEST
1 series · 3 of 3 positions shown · non-contrast
Comparison: 09/02/2013 chest and abdomen radiographs.

CLINICAL DATA: Abdominal pain.  Constipation.  Nausea and vomiting.

EXAM:
DG ABDOMEN ACUTE W/ 1V CHEST

[Series 1: dg abd acute w/chest · 0.14mm/px · 3 of 3 slices shown]
[im 1/3]
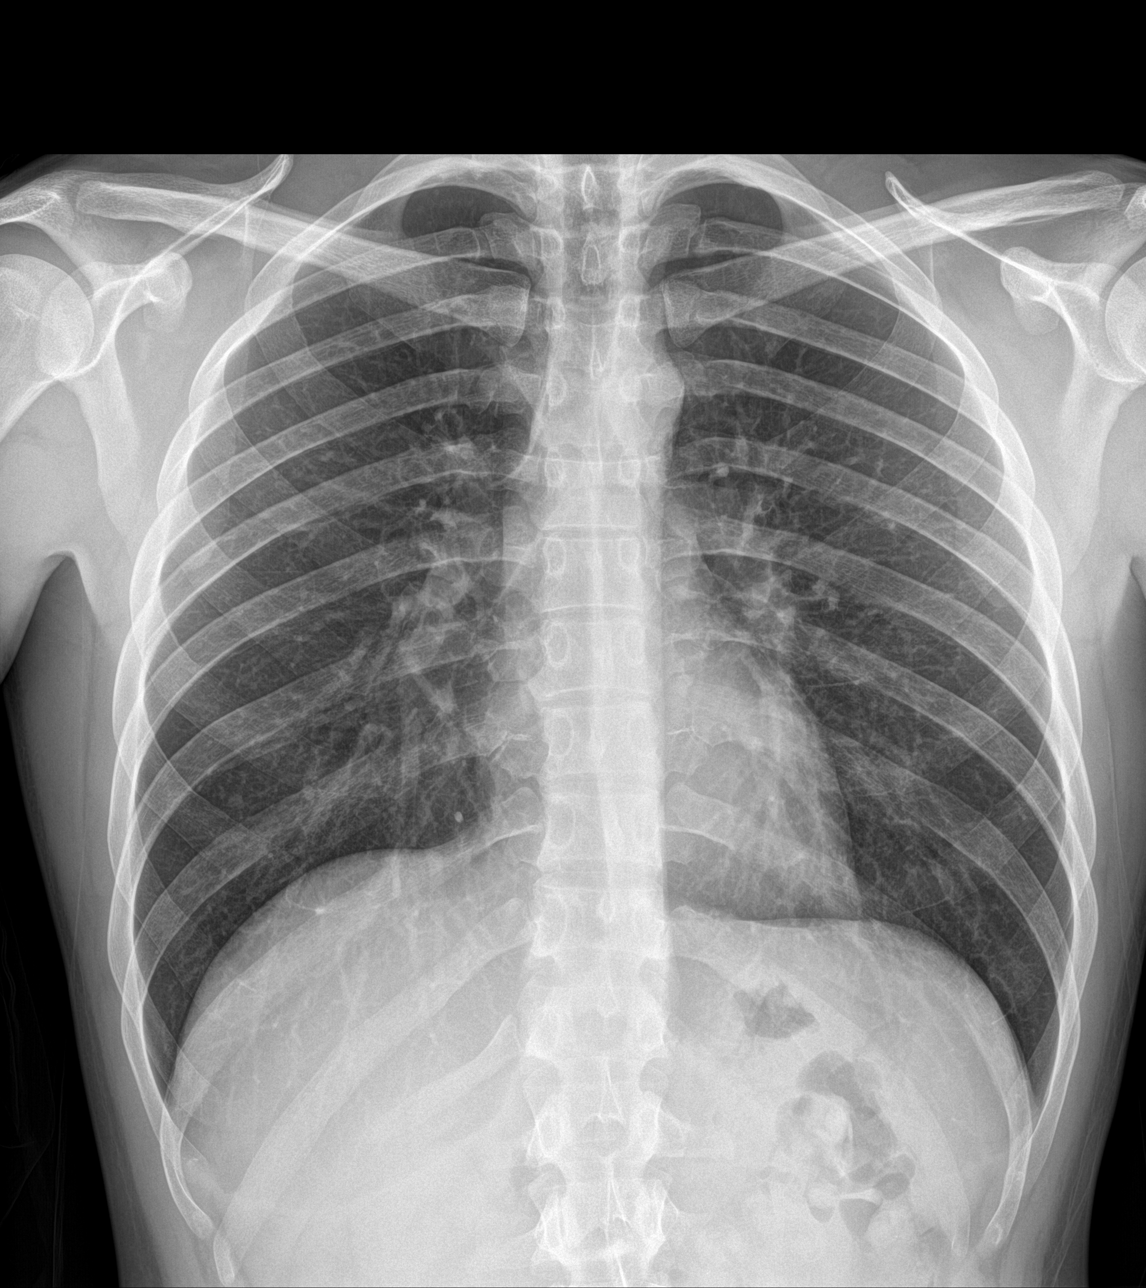
[im 2/3]
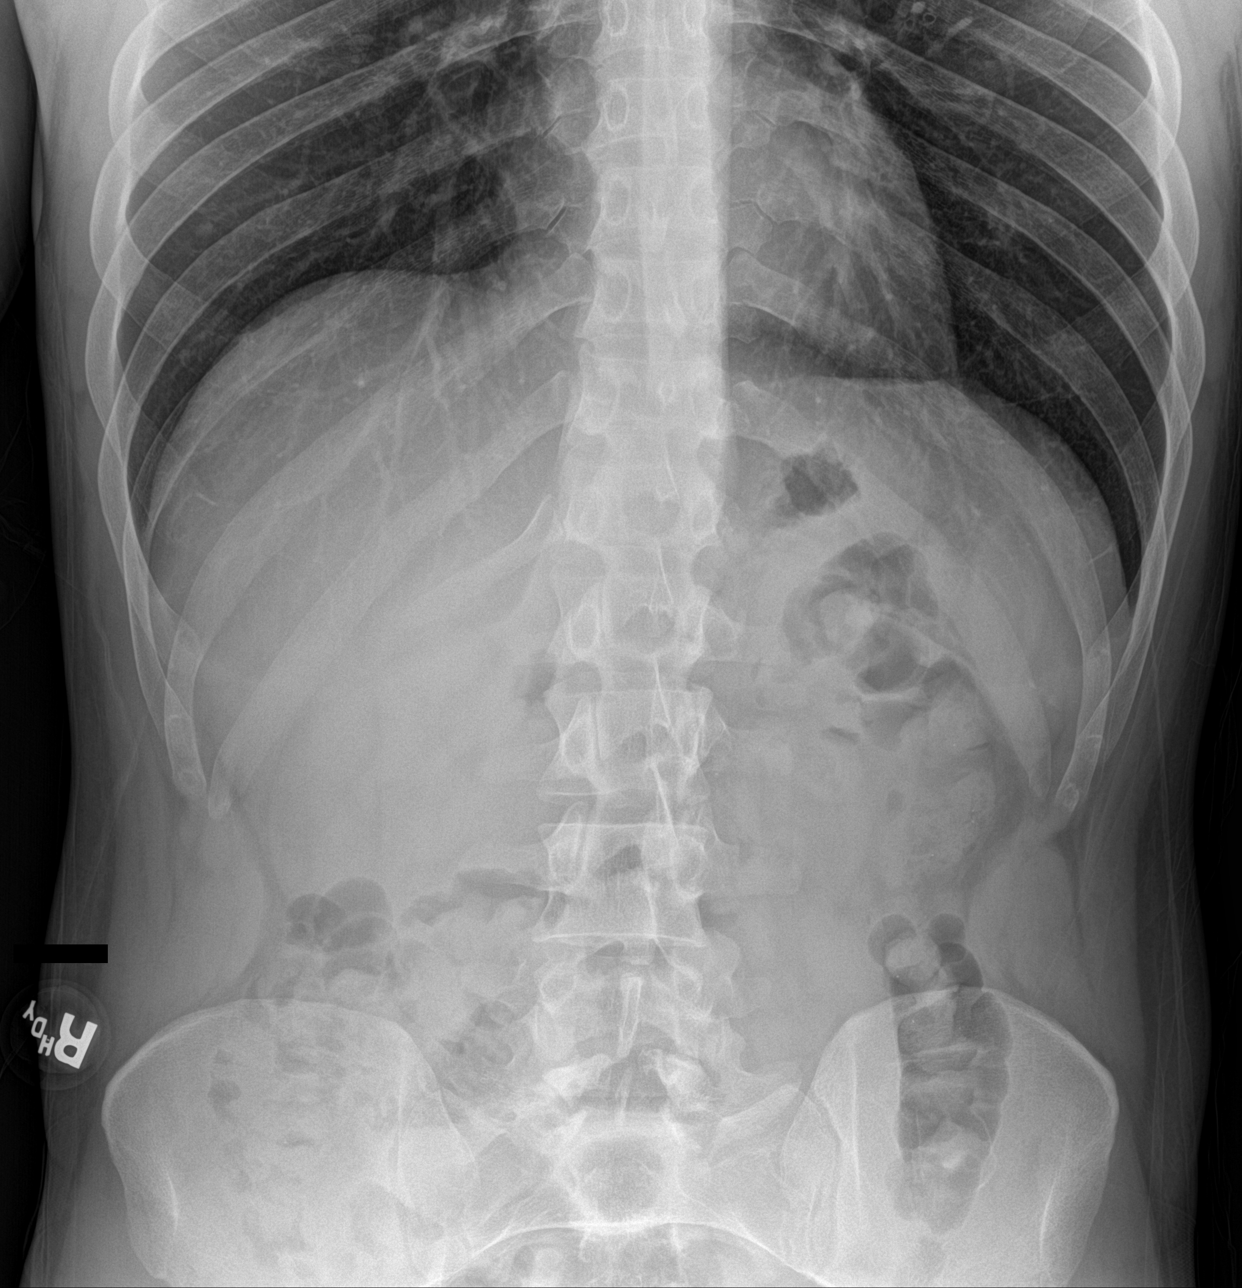
[im 3/3]
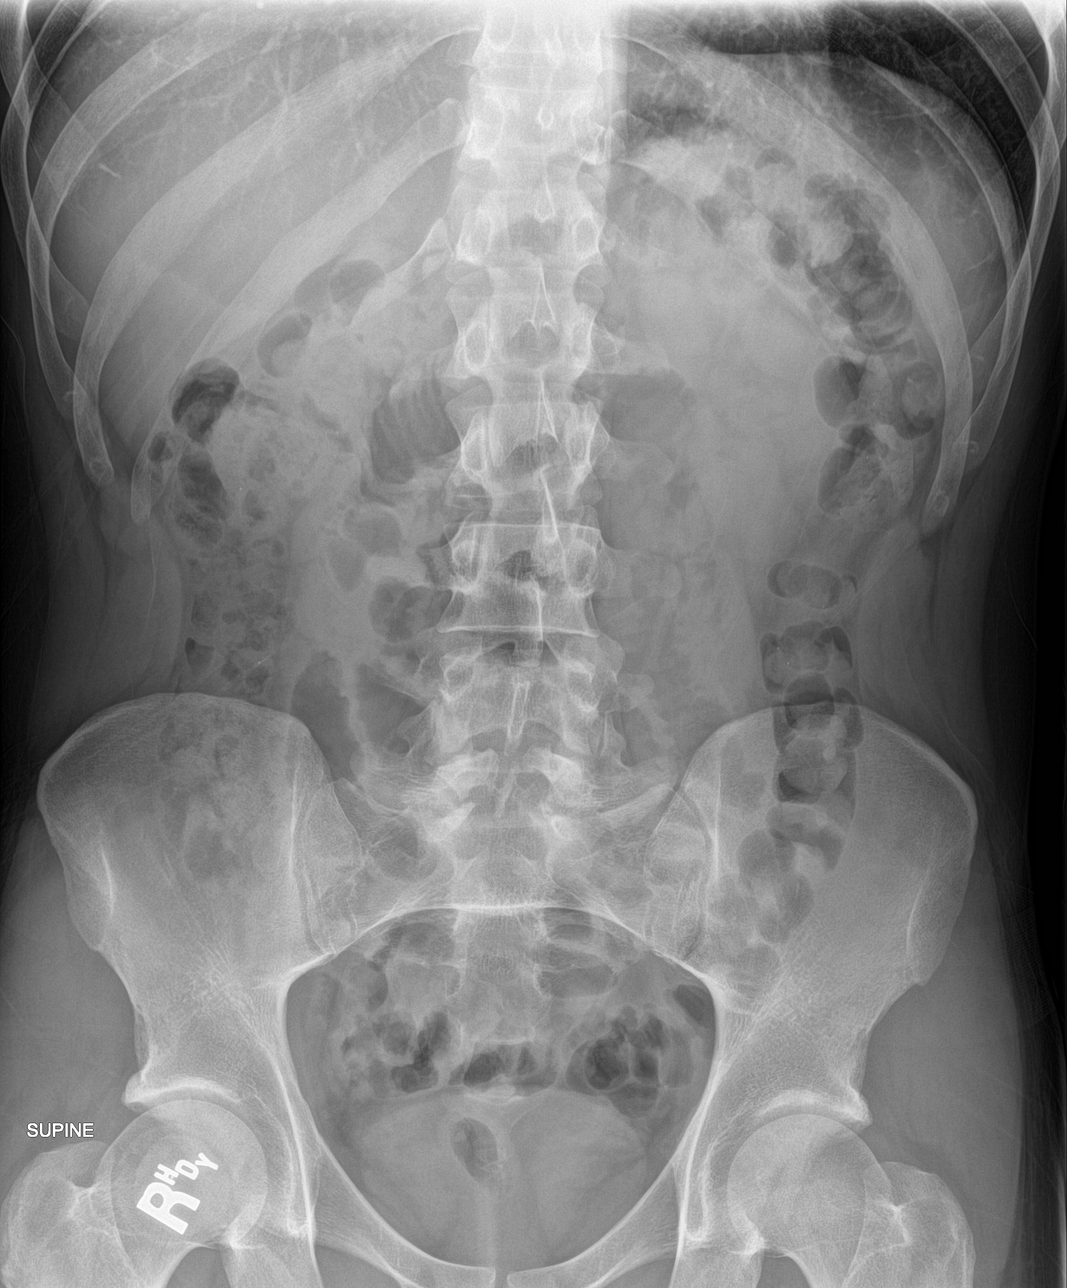

[3 of 3 positions shown; findings below may reference images not displayed]

FINDINGS: Stable cardiomediastinal silhouette with normal heart size. No
pneumothorax. No pleural effusion. Lungs appear clear, with no acute
consolidative airspace disease and no pulmonary edema. Top-normal
caliber small bowel loops without air-fluid levels. No
disproportionately dilated small bowel loops. Mild-to-moderate
colonic stool volume. No evidence of pneumatosis or
pneumoperitoneum.
IMPRESSION: 1. No active disease in the chest.
2. No specific findings of small-bowel obstruction. Top-normal
caliber small bowel loops without air-fluid levels .
3. Mild-to-moderate colonic stool volume.

## 2018-01-03 ENCOUNTER — Emergency Department
Admission: EM | Admit: 2018-01-03 | Discharge: 2018-01-03 | Disposition: A | Discharge status: Home / Self Care | Payer: Self-pay | Attending: Emergency Medicine | Admitting: Emergency Medicine

## 2018-01-03 ENCOUNTER — Encounter: Payer: Self-pay | Admitting: Medical Oncology

## 2018-01-03 ENCOUNTER — Emergency Department: Payer: Self-pay

## 2018-01-03 DIAGNOSIS — Y92410 Unspecified street and highway as the place of occurrence of the external cause: Secondary | ICD-10-CM | POA: Insufficient documentation

## 2018-01-03 DIAGNOSIS — F17210 Nicotine dependence, cigarettes, uncomplicated: Secondary | ICD-10-CM | POA: Insufficient documentation

## 2018-01-03 DIAGNOSIS — S61213A Laceration without foreign body of left middle finger without damage to nail, initial encounter: Secondary | ICD-10-CM | POA: Insufficient documentation

## 2018-01-03 DIAGNOSIS — Y9301 Activity, walking, marching and hiking: Secondary | ICD-10-CM | POA: Insufficient documentation

## 2018-01-03 DIAGNOSIS — W268XXA Contact with other sharp object(s), not elsewhere classified, initial encounter: Secondary | ICD-10-CM | POA: Insufficient documentation

## 2018-01-03 DIAGNOSIS — Y999 Unspecified external cause status: Secondary | ICD-10-CM | POA: Insufficient documentation

## 2018-01-03 MED ORDER — LIDOCAINE HCL (PF) 1 % IJ SOLN
5.0000 mL | Freq: Once | INTRAMUSCULAR | Status: DC
  Filled 2018-01-03: qty 5

## 2018-01-03 MED ORDER — BACITRACIN-NEOMYCIN-POLYMYXIN 400-5-5000 EX OINT
TOPICAL_OINTMENT | Freq: Once | CUTANEOUS | Status: AC
  Administered 2018-01-03: 1 via TOPICAL
  Filled 2018-01-03: qty 1

## 2018-01-03 NOTE — ED Provider Notes (Signed)
[facility] Emergency Department Provider Note  ____________________________________________   First MD Initiated Contact with Patient 01/03/18 1724     (approximate)  I have reviewed the triage vital signs and the nursing notes.   HISTORY  Chief Complaint Laceration    HPI Oscar Miranda is a 32 y.o. male presents emergency department with a laceration to the left middle finger.  He states that he was walking down the street tripped and fell on part of a broken bottle.  He states his last tetanus was 4 years ago.  He denies any other injuries at this time.    History reviewed. No pertinent past medical history.  There are no active problems to display for this patient.   History reviewed. No pertinent surgical history.  Prior to Admission medications   Not on File    Allergies Patient has no known allergies.  No family history on file.  Social History Social History   Tobacco Use  . Smoking status: Current Every Day Smoker    Packs/day: 0.50    Types: Cigarettes  . Smokeless tobacco: Never Used  Substance Use Topics  . Alcohol use: Yes  . Drug use: No    Review of Systems  Constitutional: No fever/chills Eyes: No visual changes. ENT: No sore throat. Respiratory: Denies cough Genitourinary: Negative for dysuria. Musculoskeletal: Negative for back pain.  Positive for laceration to the left middle finger. Skin: Negative for rash.  Positive for laceration    ____________________________________________   PHYSICAL EXAM:  VITAL SIGNS: ED Triage Vitals [01/03/18 1701]  Enc Vitals Group     BP      Pulse      Resp      Temp      Temp src      SpO2      Weight 130 lb 1.1 oz (59 kg)     Height      Head Circumference      Peak Flow      Pain Score 7     Pain Loc      Pain Edu?      Excl. in GC?     Constitutional: Alert and oriented. Well appearing and in no acute distress. Eyes: Conjunctivae are normal.  Head:  Atraumatic. Nose: No congestion/rhinnorhea. Mouth/Throat: Mucous membranes are moist.   Neck:  supple no lymphadenopathy noted Cardiovascular: Normal rate, regular rhythm.  Respiratory: Normal respiratory effort.  No retractions GU: deferred Musculoskeletal: FROM all extremities, warm and well perfused.  There is a 1 cm laceration on the dorsum of the left middle finger.  No foreign body or tendon involvement is noted.  Patient has full range of motion. Neurologic:  Normal speech and language.  Skin:  Skin is warm, dry.  Positive for laceration.. No rash noted. Psychiatric: Mood and affect are normal. Speech and behavior are normal.  ____________________________________________   LABS (all labs ordered are listed, but only abnormal results are displayed)  Labs Reviewed - No data to display ____________________________________________   ____________________________________________  RADIOLOGY  X-ray left hand does not show a fracture or foreign body.  ____________________________________________   PROCEDURES  Procedure(s) performed:   Marland KitchenMarland KitchenLaceration Repair Date/Time: 01/03/2018 8:04 PM Performed by: Faythe Ghee, PA-C Authorized by: Faythe Ghee, PA-C   Consent:    Consent obtained:  Verbal   Consent given by:  Patient   Risks discussed:  Infection, pain, retained foreign body, poor cosmetic result and poor wound healing  Alternatives discussed:  No treatment Anesthesia (see MAR for exact dosages):    Anesthesia method:  Local infiltration   Local anesthetic:  Lidocaine 1% w/o epi Laceration details:    Location:  Finger   Finger location:  L [place] finger   Length (cm):  1   Depth (mm):  2 Repair type:    Repair type:  Simple Pre-procedure details:    Preparation:  Patient was prepped and draped in usual sterile fashion Exploration:    Hemostasis achieved with:  Direct pressure   Wound exploration: wound explored through full range of motion     Wound  extent: no foreign bodies/material noted, no tendon damage noted and no underlying fracture noted   Treatment:    Area cleansed with:  Betadine and saline   Amount of cleaning:  Standard   Irrigation solution:  Sterile saline   Irrigation method:  Syringe and tap   Visualized foreign bodies/material removed: no   Skin repair:    Repair method:  Sutures   Suture size:  5-0   Suture material:  Nylon   Suture technique:  Simple interrupted   Number of sutures:  4 Approximation:    Approximation:  Close Post-procedure details:    Dressing:  Antibiotic ointment and non-adherent dressing   Patient tolerance of procedure:  Tolerated well, no immediate complications Comments:     The procedure was performed by the [facility] student Diamond Nickel under my supervision      ____________________________________________   INITIAL IMPRESSION / ASSESSMENT AND PLAN / ED COURSE  Pertinent labs & imaging results that were available during my care of the patient were reviewed by me and considered in my medical decision making (see chart for details).   Patient is a 32 year old male presents emergency department for laceration to the left middle finger.  Physical exam patient has a 1 cm laceration across the dorsum of the left middle finger.  No foreign body or tendon involvement is noted.  The area was sutured by the PA student.  4 simple sutures were inserted.  Patient tolerated procedure well.  Dressing was applied by nursing staff.  Instructions were given to the patient on how to care for the sutures.  He is to wash the area with soap and water daily.  Watch for any signs of infection.  Return emergency department if any sign of infection or see his regular doctor.  Sutures are to be removed in 7 to 10 days.  He states he understands will comply.  Was discharged in stable condition     As part of my medical decision making, I reviewed the following data within the electronic MEDICAL RECORD NUMBER Nursing  notes reviewed and incorporated, Old chart reviewed, Radiograph reviewed x-ray of the left hand is negative for fracture or foreign body, Notes from prior ED visits and [place] Controlled Substance Database  ____________________________________________   FINAL CLINICAL IMPRESSION(S) / ED DIAGNOSES  Final diagnoses:  Laceration of left middle finger without foreign body without damage to nail, initial encounter      NEW MEDICATIONS STARTED DURING THIS VISIT:  Current Discharge Medication List       Note:  This document was prepared using Dragon voice recognition software and may include unintentional dictation errors.    Faythe Ghee, PA-C 01/03/18 Encarnacion Slates, MD 01/07/18 [phone]

## 2018-01-03 NOTE — Discharge Instructions (Addendum)
Follow-up with your regular doctor or the acute care for suture removal in 7 to 10 days.  Or you may return to the emergency department.  Clean the area with soap and water daily.  Keep it covered when you are out in public.  While you are at home let the air get to the wound to help it heal.  Return if any signs of infection.

## 2018-01-03 NOTE — ED Triage Notes (Signed)
Pt reports he was walking down the road and tripped falling on a piece of glass. Pt has laceration to left hand middle finger. Bleeding but controlled with dressing this RN applied.

## 2018-01-03 NOTE — ED Notes (Signed)
See triage note    States he tripped and fell on to glass  Laceration to left hand  Mainly at middle finger/knuckle

## 2018-06-06 ENCOUNTER — Encounter: Payer: Self-pay | Admitting: Emergency Medicine

## 2018-06-06 ENCOUNTER — Emergency Department
Admission: EM | Admit: 2018-06-06 | Discharge: 2018-06-06 | Disposition: A | Discharge status: Home / Self Care | Payer: Self-pay | Attending: Emergency Medicine | Admitting: Emergency Medicine

## 2018-06-06 ENCOUNTER — Other Ambulatory Visit: Payer: Self-pay

## 2018-06-06 DIAGNOSIS — F17210 Nicotine dependence, cigarettes, uncomplicated: Secondary | ICD-10-CM | POA: Insufficient documentation

## 2018-06-06 DIAGNOSIS — R109 Unspecified abdominal pain: Secondary | ICD-10-CM | POA: Insufficient documentation

## 2018-06-06 LAB — CBC
HCT: 43.8 % (ref 39.0–52.0)
HEMOGLOBIN: 14.9 g/dL (ref 13.0–17.0)
MCH: 30.5 pg (ref 26.0–34.0)
MCHC: 34 g/dL (ref 30.0–36.0)
MCV: 89.6 fL (ref 80.0–100.0)
NRBC: 0 % (ref 0.0–0.2)
PLATELETS: 176 10*3/uL (ref 150–400)
RBC: 4.89 MIL/uL (ref 4.22–5.81)
RDW: 13.2 % (ref 11.5–15.5)
WBC: 6.8 10*3/uL (ref 4.0–10.5)

## 2018-06-06 LAB — BASIC METABOLIC PANEL
Anion gap: 8 (ref 5–15)
BUN: 14 mg/dL (ref 6–20)
CALCIUM: 8.9 mg/dL (ref 8.9–10.3)
CO2: 23 mmol/L (ref 22–32)
Chloride: 107 mmol/L (ref 98–111)
Creatinine, Ser: 0.88 mg/dL (ref 0.61–1.24)
GFR calc non Af Amer: >60 mL/min (ref >60)
GLUCOSE: 149 mg/dL — AB (ref 70–99)
POTASSIUM: 3.8 mmol/L (ref 3.5–5.1)
Sodium: 138 mmol/L (ref 135–145)

## 2018-06-06 LAB — URINALYSIS, COMPLETE (UACMP) WITH MICROSCOPIC
Bilirubin Urine: NEGATIVE
Glucose, UA: NEGATIVE mg/dL
Hgb urine dipstick: NEGATIVE
Ketones, ur: NEGATIVE mg/dL
Leukocytes,Ua: NEGATIVE
Nitrite: NEGATIVE
Protein, ur: NEGATIVE mg/dL
Specific Gravity, Urine: 1.013 (ref 1.005–1.030)
Squamous Epithelial / HPF: NONE SEEN (ref 0–5)
pH: 6 (ref 5.0–8.0)

## 2018-06-06 NOTE — Discharge Instructions (Addendum)
Return to the ER for new, worsening, or persistent severe pain, sustained pain, fever, nausea or vomiting, or any other new or worsening symptoms that concern you.

## 2018-06-06 NOTE — ED Triage Notes (Signed)
Pt in via POV with complaints of right flank pain since Saturday, some nausea, no vomiting.  Pt denies any urinary symptoms.  Ambulatory to triage; NAD noted at this time.

## 2018-06-06 NOTE — ED Notes (Signed)
Pt sitting in bed resting.

## 2018-06-06 NOTE — ED Notes (Signed)
Pt signed printed d/c paperwork.  

## 2018-06-06 NOTE — ED Provider Notes (Signed)
[facility] Emergency Department Provider Note ____________________________________________   First MD Initiated Contact with Patient 06/06/18 1414     (approximate)  I have reviewed the triage vital signs and the nursing notes.   HISTORY  Chief Complaint Flank Pain    HPI Oscar Miranda is a 33 y.o. male with no significant PMH who presents with right flank pain over the last 2 days, occurring intermittently, lasting for a few seconds at a time.  He describes it is sharp and located right in the middle of the right flank.  He states that before it started he felt slightly nauseous, but has had no nausea or vomiting since then.  He denies associated fever or urinary symptoms.  No history of trauma.  No prior history of this pain.  History reviewed. No pertinent past medical history.  There are no active problems to display for this patient.   History reviewed. No pertinent surgical history.  Prior to Admission medications   Not on File    Allergies Patient has no known allergies.  No family history on file.  Social History Social History   Tobacco Use  . Smoking status: Current Every Day Smoker    Packs/day: 0.50    Types: Cigarettes  . Smokeless tobacco: Never Used  Substance Use Topics  . Alcohol use: Yes  . Drug use: Yes    Types: Marijuana    Review of Systems  Constitutional: No fever. Eyes: No redness. ENT: No neck pain. Cardiovascular: Denies chest pain. Respiratory: Denies shortness of breath. Gastrointestinal: No nausea, no vomiting.  No diarrhea.  Genitourinary: Negative for dysuria or hematuria.  Musculoskeletal: Negative for back pain. Skin: Negative for rash. Neurological: Negative for focal weakness or numbness.   ____________________________________________   PHYSICAL EXAM:  VITAL SIGNS: ED Triage Vitals  Enc Vitals Group     BP 06/06/18 1304 (!) 150/92     Pulse Rate 06/06/18 1304 79     Resp 06/06/18  1304 16     Temp 06/06/18 1304 (!) 97.5 F (36.4 C)     Temp Source 06/06/18 1304 Oral     SpO2 06/06/18 1304 100 %     Weight 06/06/18 1305 130 lb (59 kg)     Height 06/06/18 1305 5\' 2"  (1.575 m)     Head Circumference --      Peak Flow --      Pain Score 06/06/18 1304 4     Pain Loc --      Pain Edu? --      Excl. in GC? --     Constitutional: Alert and oriented. Well appearing and in no acute distress. Eyes: Conjunctivae are normal.  Head: Atraumatic. Nose: No congestion/rhinnorhea. Mouth/Throat: Mucous membranes are moist.   Neck: Normal range of motion.  Cardiovascular: Good peripheral circulation. Respiratory: Normal respiratory effort.   Gastrointestinal: Soft and nontender. No distention.  Genitourinary: No CVA or flank tenderness. Musculoskeletal: Extremities warm and well perfused.  Neurologic:  Normal speech and language. No gross focal neurologic deficits are appreciated.  Skin:  Skin is warm and dry. No rash noted. Psychiatric: Mood and affect are normal. Speech and behavior are normal.  ____________________________________________   LABS (all labs ordered are listed, but only abnormal results are displayed)  Labs Reviewed  URINALYSIS, COMPLETE (UACMP) WITH MICROSCOPIC - Abnormal; Notable for the following components:      Result Value   Color, Urine STRAW (*)    APPearance CLEAR (*)  Bacteria, UA RARE (*)    All other components within normal limits  BASIC METABOLIC PANEL - Abnormal; Notable for the following components:   Glucose, Bld 149 (*)    All other components within normal limits  CBC   ____________________________________________  EKG   ____________________________________________  RADIOLOGY    ____________________________________________   PROCEDURES  Procedure(s) performed: No  Procedures  Critical Care performed: No ____________________________________________   INITIAL IMPRESSION / ASSESSMENT AND PLAN / ED COURSE   Pertinent labs & imaging results that were available during my care of the patient were reviewed by me and considered in my medical decision making (see chart for details).  33 year old male with no significant PMH presents with intermittent right flank pain over the last 2 days, lasting for a few seconds at a time and with no significant associated symptoms.  No prior history of this pain.  On exam the patient is well-appearing and his vital signs are normal except for hypertension.  The abdomen is soft and nontender, and he has no CVA tenderness or other acute abnormalities.  The lab work-up is unremarkable.  UA is negative.  At this time, there is no clinical evidence for acute appendicitis, ureteral stone, pyelonephritis, or other concerning acute cause.  I suspect most likely musculoskeletal pain.  I discussed the possibility of imaging with the patient, however at this time there is no indication for CT or ultrasound.  He is stable for discharge home.  He agrees with this plan.  I discussed return precautions with him, and he expressed understanding.  ____________________________________________   FINAL CLINICAL IMPRESSION(S) / ED DIAGNOSES  Final diagnoses:  Right flank pain      NEW MEDICATIONS STARTED DURING THIS VISIT:  New Prescriptions   No medications on file     Note:  This document was prepared using Dragon voice recognition software and may include unintentional dictation errors.    Dionne Bucy, MD 06/06/18 1531

## 2018-08-18 ENCOUNTER — Other Ambulatory Visit: Payer: Self-pay

## 2018-08-18 ENCOUNTER — Emergency Department
Admission: EM | Admit: 2018-08-18 | Discharge: 2018-08-18 | Disposition: A | Discharge status: Home / Self Care | Payer: Self-pay | Attending: Emergency Medicine | Admitting: Emergency Medicine

## 2018-08-18 ENCOUNTER — Encounter: Payer: Self-pay | Admitting: Emergency Medicine

## 2018-08-18 DIAGNOSIS — Z711 Person with feared health complaint in whom no diagnosis is made: Secondary | ICD-10-CM | POA: Insufficient documentation

## 2018-08-18 DIAGNOSIS — F17210 Nicotine dependence, cigarettes, uncomplicated: Secondary | ICD-10-CM | POA: Insufficient documentation

## 2018-08-18 DIAGNOSIS — Z7189 Other specified counseling: Secondary | ICD-10-CM | POA: Insufficient documentation

## 2018-08-18 DIAGNOSIS — Z0279 Encounter for issue of other medical certificate: Secondary | ICD-10-CM | POA: Insufficient documentation

## 2018-08-18 DIAGNOSIS — F1210 Cannabis abuse, uncomplicated: Secondary | ICD-10-CM | POA: Insufficient documentation

## 2018-08-18 NOTE — ED Provider Notes (Signed)
[facility] Emergency Department Provider Note  ____________________________________________   First MD Initiated Contact with Patient 08/18/18 1410     (approximate)  I have reviewed the triage vital signs and the nursing notes.   HISTORY  Chief Complaint Nausea    HPI Oscar Miranda is a 33 y.o. male presents emergency department requesting a work note.  He states he missed the last 2 nights at work.  He was working at [employer] but they fired him due to missing work.  He needs a work note for the [employer] stating he does not have COVID.  He states he had 2 episodes of vomiting and some nausea the other night after eating something that did not "agree with him ".  He denies any exposure to someone with COVID.  He has had no cough, congestion, fever, chills, or abdominal pain.    History reviewed. No pertinent past medical history.  There are no active problems to display for this patient.   History reviewed. No pertinent surgical history.  Prior to Admission medications   Not on File    Allergies Patient has no known allergies.  No family history on file.  Social History Social History   Tobacco Use  . Smoking status: Current Every Day Smoker    Packs/day: 0.50    Types: Cigarettes  . Smokeless tobacco: Never Used  Substance Use Topics  . Alcohol use: Yes    Comment: twice per week  . Drug use: Yes    Types: Marijuana    Comment: daily    Review of Systems  Constitutional: No fever/chills, here for work note Eyes: No visual changes. ENT: No sore throat. Respiratory: Denies cough Gastrointestinal: 2 episodes of vomiting 2 days ago none since Genitourinary: Negative for dysuria. Musculoskeletal: Negative for back pain. Skin: Negative for rash.    ____________________________________________   PHYSICAL EXAM:  VITAL SIGNS: ED Triage Vitals  Enc Vitals Group     BP 08/18/18 1302 (!) 157/92     Pulse Rate 08/18/18 1302  96     Resp 08/18/18 1302 18     Temp 08/18/18 1302 98.4 F (36.9 C)     Temp Source 08/18/18 1302 Oral     SpO2 08/18/18 1302 98 %     Weight 08/18/18 1259 127 lb (57.6 kg)     Height 08/18/18 1259 5\' 2"  (1.575 m)     Head Circumference --      Peak Flow --      Pain Score 08/18/18 1259 0     Pain Loc --      Pain Edu? --      Excl. in GC? --     Constitutional: Alert and oriented. Well appearing and in no acute distress. Eyes: Conjunctivae are normal.  Head: Atraumatic. Nose: No congestion/rhinnorhea. Mouth/Throat: Mucous membranes are moist.   Neck:  supple no lymphadenopathy noted Cardiovascular: Normal rate, regular rhythm. Heart sounds are normal Respiratory: Normal respiratory effort.  No retractions, lungs c t a  Abd: soft nontender bs normal all 4 quad, negative Murphy sign, negative McBurney's point tenderness GU: deferred Musculoskeletal: FROM all extremities, warm and well perfused Neurologic:  Normal speech and language.  Skin:  Skin is warm, dry and intact. No rash noted. Psychiatric: Mood and affect are normal. Speech and behavior are normal.  ____________________________________________   LABS (all labs ordered are listed, but only abnormal results are displayed)  Labs Reviewed - No data to display ____________________________________________  ____________________________________________  RADIOLOGY    ____________________________________________   PROCEDURES  Procedure(s) performed: No  Procedures    ____________________________________________   INITIAL IMPRESSION / ASSESSMENT AND PLAN / ED COURSE  Pertinent labs & imaging results that were available during my care of the patient were reviewed by me and considered in my medical decision making (see chart for details).   Patient is 33 year old male presents emergency department with request for work note.  Explained to him the symptoms that he had are not very typical of COVID.  We will  not be performing a COVID test as he is asymptomatic.  He states he just needs a work note.  He was given a work note stating he was seen and treated here with no symptoms of COVID.  Is discharged stable condition.  Oscar Miranda was evaluated in Emergency Department on 08/18/2018 for the symptoms described in the history of present illness. He was evaluated in the context of the global COVID-19 pandemic, which necessitated consideration that the patient might be at risk for infection with the SARS-CoV-2 virus that causes COVID-19. Institutional protocols and algorithms that pertain to the evaluation of patients at risk for COVID-19 are in a state of rapid change based on information released by regulatory bodies including the CDC and federal and state organizations. These policies and algorithms were followed during the patient's care in the ED.      As part of my medical decision making, I reviewed the following data within the electronic MEDICAL RECORD NUMBER Nursing notes reviewed and incorporated, Old chart reviewed, Notes from prior ED visits and [place] Controlled Substance Database  ____________________________________________   FINAL CLINICAL IMPRESSION(S) / ED DIAGNOSES  Final diagnoses:  Feared complaint without diagnosis  Educated About Covid-19 Virus Infection      NEW MEDICATIONS STARTED DURING THIS VISIT:  New Prescriptions   No medications on file     Note:  This document was prepared using Dragon voice recognition software and may include unintentional dictation errors.    Faythe Ghee, PA-C 08/18/18 1421    Don Perking, [place], MD 08/18/18 [phone]

## 2018-08-18 NOTE — ED Triage Notes (Signed)
Pt via pov from home requesting work note to return to work. He had to miss work the last 2 nights and needs a work note saying he does not have covid to return to work. Pt alert & oriented with NAD noted.

## 2018-10-07 ENCOUNTER — Emergency Department
Admission: EM | Admit: 2018-10-07 | Discharge: 2018-10-07 | Disposition: A | Discharge status: Home / Self Care | Payer: Self-pay | Attending: Emergency Medicine | Admitting: Emergency Medicine

## 2018-10-07 ENCOUNTER — Encounter: Payer: Self-pay | Admitting: Emergency Medicine

## 2018-10-07 ENCOUNTER — Other Ambulatory Visit: Payer: Self-pay

## 2018-10-07 DIAGNOSIS — R112 Nausea with vomiting, unspecified: Secondary | ICD-10-CM | POA: Insufficient documentation

## 2018-10-07 DIAGNOSIS — F17210 Nicotine dependence, cigarettes, uncomplicated: Secondary | ICD-10-CM | POA: Insufficient documentation

## 2018-10-07 DIAGNOSIS — E860 Dehydration: Secondary | ICD-10-CM | POA: Insufficient documentation

## 2018-10-07 DIAGNOSIS — R1012 Left upper quadrant pain: Secondary | ICD-10-CM | POA: Insufficient documentation

## 2018-10-07 LAB — COMPREHENSIVE METABOLIC PANEL
ALT: 23 U/L (ref 0–44)
AST: 26 U/L (ref 15–41)
Albumin: 5.3 g/dL — ABNORMAL HIGH (ref 3.5–5.0)
Alkaline Phosphatase: 94 U/L (ref 38–126)
Anion gap: 16 — ABNORMAL HIGH (ref 5–15)
BUN: 23 mg/dL — ABNORMAL HIGH (ref 6–20)
CO2: 20 mmol/L — ABNORMAL LOW (ref 22–32)
Calcium: 9.8 mg/dL (ref 8.9–10.3)
Chloride: 97 mmol/L — ABNORMAL LOW (ref 98–111)
Creatinine, Ser: 1.13 mg/dL (ref 0.61–1.24)
GFR calc Af Amer: >60 mL/min (ref >60)
GFR calc non Af Amer: >60 mL/min (ref >60)
Glucose, Bld: 149 mg/dL — ABNORMAL HIGH (ref 70–99)
Potassium: 3.2 mmol/L — ABNORMAL LOW (ref 3.5–5.1)
Sodium: 133 mmol/L — ABNORMAL LOW (ref 135–145)
Total Bilirubin: 2.2 mg/dL — ABNORMAL HIGH (ref 0.3–1.2)
Total Protein: 9 g/dL — ABNORMAL HIGH (ref 6.5–8.1)

## 2018-10-07 LAB — URINALYSIS, COMPLETE (UACMP) WITH MICROSCOPIC
Glucose, UA: NEGATIVE mg/dL
Hgb urine dipstick: NEGATIVE
Ketones, ur: 5 mg/dL — AB
Leukocytes,Ua: NEGATIVE
Nitrite: NEGATIVE
Protein, ur: 100 mg/dL — AB
Specific Gravity, Urine: 1.035 — ABNORMAL HIGH (ref 1.005–1.030)
pH: 5 (ref 5.0–8.0)

## 2018-10-07 LAB — CBC
HCT: 51.5 % (ref 39.0–52.0)
Hemoglobin: 18.3 g/dL — ABNORMAL HIGH (ref 13.0–17.0)
MCH: 30.9 pg (ref 26.0–34.0)
MCHC: 35.5 g/dL (ref 30.0–36.0)
MCV: 86.8 fL (ref 80.0–100.0)
Platelets: 282 10*3/uL (ref 150–400)
RBC: 5.93 MIL/uL — ABNORMAL HIGH (ref 4.22–5.81)
RDW: 12.4 % (ref 11.5–15.5)
WBC: 10.9 10*3/uL — ABNORMAL HIGH (ref 4.0–10.5)
nRBC: 0 % (ref 0.0–0.2)

## 2018-10-07 LAB — LIPASE, BLOOD: Lipase: 39 U/L (ref 11–51)

## 2018-10-07 MED ORDER — SODIUM CHLORIDE 0.9 % IV BOLUS
1000.0000 mL | Freq: Once | INTRAVENOUS | Status: AC
  Administered 2018-10-07: 1000 mL via INTRAVENOUS

## 2018-10-07 MED ORDER — ONDANSETRON 4 MG PO TBDP
4.0000 mg | ORAL_TABLET | Freq: Once | ORAL | Status: AC | PRN
  Administered 2018-10-07: 4 mg via ORAL

## 2018-10-07 MED ORDER — METOCLOPRAMIDE HCL 10 MG PO TABS: 10.0000 mg | ORAL_TABLET | Freq: Three times a day (TID) | ORAL | 0 refills | Status: DC | PRN

## 2018-10-07 MED ORDER — ONDANSETRON HCL 4 MG/2ML IJ SOLN
4.0000 mg | Freq: Once | INTRAMUSCULAR | Status: AC
  Administered 2018-10-07: 4 mg via INTRAVENOUS
  Filled 2018-10-07: qty 2

## 2018-10-07 MED ORDER — ONDANSETRON 4 MG PO TBDP
ORAL_TABLET | ORAL | Status: AC
  Filled 2018-10-07: qty 1

## 2018-10-07 NOTE — ED Notes (Signed)
Pt given cup of ice and ice water to drink.

## 2018-10-07 NOTE — Discharge Instructions (Signed)
You can take the prescribed Reglan as needed for nausea or vomiting.  You should also take Pepcid twice daily for at least the next week to help calm down your stomach lining.  Avoid marijuana use.  Return to the ER for new, worsening, or persistent severe vomiting or abdominal pain, not being able to hold anything down, fevers, weakness, or any other new or worsening symptoms that concern you.

## 2018-10-07 NOTE — ED Provider Notes (Signed)
[facility] Emergency Department Provider Note ____________________________________________   First MD Initiated Contact with Patient 10/07/18 [phone]     (approximate)  I have reviewed the triage vital signs and the nursing notes.   HISTORY  Chief Complaint Emesis    HPI Oscar Miranda is a 33 y.o. male with no significant PMH who presents with nausea and vomiting over the last 2 days, multiple episodes per day, nonbloody and nonbilious, associated with some left upper quadrant abdominal pain.  The patient denies diarrhea or fever.  He states he has had episodes like this a few times in the past.  He denies any sick contacts, travel, or unusual foods.  History reviewed. No pertinent past medical history.  There are no active problems to display for this patient.   History reviewed. No pertinent surgical history.  Prior to Admission medications   Medication Sig Start Date End Date Taking? Authorizing Provider  metoCLOPramide (REGLAN) 10 MG tablet Take 1 tablet (10 mg total) by mouth every 8 (eight) hours as needed for up to 5 days for nausea or vomiting. 10/07/18 10/12/18  Dionne BucySiadecki, Enyla [place], MD    Allergies Patient has no known allergies.  No family history on file.  Social History Social History   Tobacco Use  . Smoking status: Current Every Day Smoker    Packs/day: 0.50    Types: Cigarettes  . Smokeless tobacco: Never Used  Substance Use Topics  . Alcohol use: Yes    Comment: occ  . Drug use: Yes    Types: Marijuana    Comment: daily    Review of Systems  Constitutional: No fever. Eyes: No redness. ENT: No sore throat. Cardiovascular: Denies chest pain. Respiratory: Denies shortness of breath. Gastrointestinal: Positive for nausea and vomiting. Genitourinary: Negative for flank pain.  Musculoskeletal: Negative for back pain. Skin: Negative for rash. Neurological: Negative for headache.    ____________________________________________   PHYSICAL EXAM:  VITAL SIGNS: ED Triage Vitals  Enc Vitals Group     BP 10/07/18 0526 (!) 152/106     Pulse Rate 10/07/18 0526 92     Resp 10/07/18 0526 18     Temp 10/07/18 0526 (!) 97.4 F (36.3 C)     Temp Source 10/07/18 0526 Oral     SpO2 10/07/18 0526 98 %     Weight 10/07/18 0527 122 lb (55.3 kg)     Height 10/07/18 0527 5\' 2"  (1.575 m)     Head Circumference --      Peak Flow --      Pain Score 10/07/18 0527 0     Pain Loc --      Pain Edu? --      Excl. in [place]? --     Constitutional: Alert and oriented.  Slightly uncomfortable appearing but in no acute distress. Eyes: Conjunctivae are normal.  Head: Atraumatic. Nose: No congestion/rhinnorhea. Mouth/Throat: Mucous membranes are dry.   Neck: Normal range of motion.  Cardiovascular: Good peripheral circulation. Respiratory: Normal respiratory effort.  No retractions. Gastrointestinal: Soft with mild left upper quadrant tenderness.  No distention.  Genitourinary: No flank tenderness. Musculoskeletal: No lower extremity edema.  Extremities warm and well perfused.  Neurologic:  Normal speech and language. No gross focal neurologic deficits are appreciated.  Skin:  Skin is warm and dry. No rash noted. Psychiatric: Mood and affect are normal. Speech and behavior are normal.  ____________________________________________   LABS (all labs ordered are listed, but only abnormal results are displayed)  Labs Reviewed  COMPREHENSIVE METABOLIC PANEL - Abnormal; Notable for the following components:      Result Value   Sodium 133 (*)    Potassium 3.2 (*)    Chloride 97 (*)    CO2 20 (*)    Glucose, Bld 149 (*)    BUN 23 (*)    Total Protein 9.0 (*)    Albumin 5.3 (*)    Total Bilirubin 2.2 (*)    Anion gap 16 (*)    All other components within normal limits  CBC - Abnormal; Notable for the following components:   WBC 10.9 (*)    RBC 5.93 (*)    Hemoglobin 18.3 (*)     All other components within normal limits  URINALYSIS, COMPLETE (UACMP) WITH MICROSCOPIC - Abnormal; Notable for the following components:   Color, Urine AMBER (*)    APPearance CLOUDY (*)    Specific Gravity, Urine 1.035 (*)    Bilirubin Urine SMALL (*)    Ketones, ur 5 (*)    Protein, ur 100 (*)    Bacteria, UA RARE (*)    All other components within normal limits  LIPASE, BLOOD   ____________________________________________  EKG   ____________________________________________  RADIOLOGY    ____________________________________________   PROCEDURES  Procedure(s) performed: No  Procedures  Critical Care performed: No ____________________________________________   INITIAL IMPRESSION / ASSESSMENT AND PLAN / ED COURSE  Pertinent labs & imaging results that were available during my care of the patient were reviewed by me and considered in my medical decision making (see chart for details).  33 year old male with no significant PMH presents with nausea and vomiting over the last several days with some left upper abdominal discomfort.  He has no diarrhea or fever.  He does admit to marijuana use.  On exam, the patient appears slightly uncomfortable but is in no distress.  The abdomen is soft with mild left upper quadrant discomfort.  There are no other significant exam findings.  Overall presentation is consistent with acute gastroenteritis, gastritis, cyclical vomiting possibly cannabinoid related, or other benign etiology.  Lab work-up obtained from triage shows no concerning findings such as elevated WBC count or abnormal LFTs, but it is consistent with dehydration with elevated hemoglobin and slight anion gap acidosis.  We will give fluids, antiemetic, and reassess.  ----------------------------------------- 1:58 PM on 10/07/2018 -----------------------------------------  Patient was feeling much better after fluids and wanted to go home.  He is not vomiting and is  able to tolerate p.o.  Given that he is now tolerating p.o., there is no indication to repeat his labs after the fluids.  I counseled him on the results of the work-up.  Return precautions given, and he expressed understanding.  ____________________________________________   FINAL CLINICAL IMPRESSION(S) / ED DIAGNOSES  Final diagnoses:  Non-intractable vomiting with nausea, unspecified vomiting type  Dehydration      NEW MEDICATIONS STARTED DURING THIS VISIT:  Discharge Medication List as of 10/07/2018 12:19 PM    START taking these medications   Details  metoCLOPramide (REGLAN) 10 MG tablet Take 1 tablet (10 mg total) by mouth every 8 (eight) hours as needed for up to 5 days for nausea or vomiting., Starting Sat 10/07/2018, Until Thu 10/12/2018, Normal         Note:  This document was prepared using Dragon voice recognition software and may include unintentional dictation errors.    Arta Silence, MD 10/07/18 1359

## 2018-10-07 NOTE — ED Notes (Signed)
Assumed care of patient aox4, nss bolus infusing. Vss. Denies nausea. Will continue to monitor.

## 2018-10-07 NOTE — ED Triage Notes (Signed)
Patient with complaint of nausea and vomiting that started Thursday morning. Patient states that he has intermittent abdominal cramping.  Patient denies diarrhea.

## 2018-10-07 NOTE — ED Notes (Signed)
Pt up ambulating in the lobby in NAD

## 2019-01-04 ENCOUNTER — Emergency Department: Payer: PRIVATE HEALTH INSURANCE

## 2019-01-04 ENCOUNTER — Other Ambulatory Visit: Payer: Self-pay

## 2019-01-04 ENCOUNTER — Emergency Department
Admission: EM | Admit: 2019-01-04 | Discharge: 2019-01-04 | Disposition: A | Discharge status: Home / Self Care | Payer: PRIVATE HEALTH INSURANCE | Attending: Emergency Medicine | Admitting: Emergency Medicine

## 2019-01-04 DIAGNOSIS — M7582 Other shoulder lesions, left shoulder: Secondary | ICD-10-CM | POA: Insufficient documentation

## 2019-01-04 DIAGNOSIS — F17210 Nicotine dependence, cigarettes, uncomplicated: Secondary | ICD-10-CM | POA: Diagnosis not present

## 2019-01-04 DIAGNOSIS — I10 Essential (primary) hypertension: Secondary | ICD-10-CM | POA: Insufficient documentation

## 2019-01-04 DIAGNOSIS — X500XXA Overexertion from strenuous movement or load, initial encounter: Secondary | ICD-10-CM | POA: Diagnosis not present

## 2019-01-04 DIAGNOSIS — M778 Other enthesopathies, not elsewhere classified: Secondary | ICD-10-CM

## 2019-01-04 DIAGNOSIS — M25512 Pain in left shoulder: Secondary | ICD-10-CM | POA: Diagnosis present

## 2019-01-04 HISTORY — DX: Essential (primary) hypertension: I10

## 2019-01-04 MED ORDER — MELOXICAM 15 MG PO TABS: 15.0000 mg | ORAL_TABLET | Freq: Every day | ORAL | 0 refills | Status: DC

## 2019-01-04 NOTE — Discharge Instructions (Signed)
Please follow up with orthopedics if not improving over the week.  No excessive straining with the left arm until pain free.  Return to the ER for symptoms of concern if unable to see primary care or orthopedics.

## 2019-01-04 NOTE — ED Triage Notes (Signed)
Pt states he injured his left shoulder 2 days ago lifting a door.

## 2019-01-04 NOTE — ED Provider Notes (Signed)
[facility] Emergency Department Provider Note ____________________________________________  Time seen: Approximately 12:37 PM  I have reviewed the triage vital signs and the nursing notes.   HISTORY  Chief Complaint Shoulder Injury    HPI Oscar Miranda is a 33 y.o. male who presents to the emergency department for evaluation and treatment of left shoulder pain.  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  Past Medical History:  Diagnosis Date  . Hypertension     There are no active problems to display for this patient.   History reviewed. No pertinent surgical history.  Prior to Admission medications   Medication Sig Start Date End Date Taking? Authorizing Provider  meloxicam (MOBIC) 15 MG tablet Take 1 tablet (15 mg total) by mouth daily. 01/04/19   Victorino Dike, FNP    Allergies Patient has no known allergies.  No family history on file.  Social History Social History   Tobacco Use  . Smoking status: Current Every Day Smoker    Packs/day: 0.50    Types: Cigarettes  . Smokeless tobacco: Never Used  Substance Use Topics  . Alcohol use: Yes    Comment: occ  . Drug use: Yes    Types: Marijuana    Comment: daily    Review of Systems Constitutional: Negative for fever. Cardiovascular: Negative for chest pain. Respiratory: Negative for shortness of breath. Musculoskeletal: Positive for left shoulder pain. Skin: Negative for open wounds or lesions Neurological: Negative for decrease in sensation  ____________________________________________   PHYSICAL EXAM:  VITAL SIGNS: ED Triage Vitals  Enc Vitals Group     BP 01/04/19 1035 (!) 159/91     Pulse Rate 01/04/19 1035 80     Resp 01/04/19 1035 15     Temp 01/04/19 1035 98 F (36.7 C)     Temp Source 01/04/19 1035 Oral     SpO2  01/04/19 1035 98 %     Weight 01/04/19 1036 130 lb (59 kg)     Height 01/04/19 1036 5\' 6"  (1.676 m)     Head Circumference --      Peak Flow --      Pain Score 01/04/19 1036 7     Pain Loc --      Pain Edu? --      Excl. in [place]? --     Constitutional: Alert and oriented. Well appearing and in no acute distress. Eyes: Conjunctivae are clear without discharge or drainage Head: Atraumatic Neck: Supple.  No focal midline tenderness. Respiratory: No cough. Respirations are even and unlabored. Musculoskeletal/Neurological: Anterior shoulder tenderness on palpation with report of radiation along the anterior deltoid, lateral elbow, and into the palm of the left hand.  Radial pulse is 2+.  Capillary refill is less than 3 seconds.  Grip strength is equal. Pain is worse with abduction. No arm drop.  Skin: No open wound or lesions. No edema over the left shoulder.  Psychiatric: Affect and behavior are appropriate.  ____________________________________________   LABS (all labs ordered are listed, but only abnormal results are displayed)  Labs Reviewed - No data to display ____________________________________________  RADIOLOGY  Image of the left shoulder is negative for acute musculoskeletal abnormality. ____________________________________________   PROCEDURES  Procedures  ____________________________________________   INITIAL IMPRESSION / ASSESSMENT AND PLAN / ED COURSE  Oscar Miranda is a  33 y.o. who presents to the emergency department for treatment and evaluation of left shoulder pain.  See HPI for further details.  X-ray is negative for acute findings.  Patient will be placed on meloxicam and advised to rest and ice the shoulder for the next few days.  He will be provided a work note for the next 2 to 3 days as well.  He was instructed to follow-up with orthopedics for symptoms that are not improving with rest and medication.  He was advised to return to the emergency department  for symptoms of change or worsen if he is unable to schedule appointment with primary care or the orthopedic specialist.   Medications - No data to display  Pertinent labs & imaging results that were available during my care of the patient were reviewed by me and considered in my medical decision making (see chart for details).  _________________________________________   FINAL CLINICAL IMPRESSION(S) / ED DIAGNOSES  Final diagnoses:  Tendonitis of shoulder, left    ED Discharge Orders         Ordered    meloxicam (MOBIC) 15 MG tablet  Daily     01/04/19 1235           If controlled substance prescribed during this visit, 12 month history viewed on the [facility] prior to issuing an initial prescription for Schedule II or III opiod.   Chinita Pester, FNP 01/04/19 1306    Emily Filbert, MD 01/04/19 1329

## 2019-06-01 ENCOUNTER — Other Ambulatory Visit: Payer: Self-pay

## 2019-06-01 ENCOUNTER — Encounter: Payer: Self-pay | Admitting: Emergency Medicine

## 2019-06-01 ENCOUNTER — Emergency Department: Payer: PRIVATE HEALTH INSURANCE

## 2019-06-01 ENCOUNTER — Emergency Department
Admission: EM | Admit: 2019-06-01 | Discharge: 2019-06-01 | Disposition: A | Discharge status: Home / Self Care | Payer: PRIVATE HEALTH INSURANCE | Attending: Emergency Medicine | Admitting: Emergency Medicine

## 2019-06-01 DIAGNOSIS — M25532 Pain in left wrist: Secondary | ICD-10-CM | POA: Diagnosis present

## 2019-06-01 DIAGNOSIS — G5622 Lesion of ulnar nerve, left upper limb: Secondary | ICD-10-CM | POA: Diagnosis not present

## 2019-06-01 DIAGNOSIS — I10 Essential (primary) hypertension: Secondary | ICD-10-CM | POA: Diagnosis not present

## 2019-06-01 DIAGNOSIS — F17210 Nicotine dependence, cigarettes, uncomplicated: Secondary | ICD-10-CM | POA: Insufficient documentation

## 2019-06-01 MED ORDER — PREDNISONE 10 MG (21) PO TBPK: ORAL_TABLET | ORAL | 0 refills | Status: DC

## 2019-06-01 NOTE — ED Provider Notes (Signed)
Emergency Department Provider Note  ____________________________________________  Time seen: Approximately 6:53 PM  I have reviewed the triage vital signs and the nursing notes.   HISTORY  Chief Complaint Hand Pain   Historian Patient     HPI Oscar Miranda is a 34 y.o. male presents to the emergency department with acute left wrist pain and paresthesias of the left fourth digit.  Patient describes a sensation of feeling like his fingers are asleep.  He also has discomfort along the medial side of his left forearm.  He did state that he was engaging in some heavy lifting several weeks ago and did hear a pop.  He noticed pain after aforementioned incident occurred.  He denies any history of cubital tunnel syndrome.  He states that he has taken meloxicam and Tylenol which has not relieved his symptoms.  He has been sleeping in a wrist splint.   Past Medical History:  Diagnosis Date  . Hypertension      Immunizations up to date:  Yes.     Past Medical History:  Diagnosis Date  . Hypertension     There are no problems to display for this patient.   History reviewed. No pertinent surgical history.  Prior to Admission medications   Medication Sig Start Date End Date Taking? Authorizing Provider  predniSONE (STERAPRED UNI-PAK 21 TAB) 10 MG (21) TBPK tablet Take 6 tablets the first day, take 5 tablets the second day, take 4 tablets the third day, take 3 tablets the fourth day, take 2 tablets the fifth day, take 1 tablet the sixth day. 06/01/19   Lannie Fields, PA-C    Allergies Patient has no known allergies.  No family history on file.  Social History Social History   Tobacco Use  . Smoking status: Current Every Day Smoker    Packs/day: 0.50    Types: Cigarettes  . Smokeless tobacco: Never Used  Substance Use Topics  . Alcohol use: Yes    Comment: occ  . Drug use: Yes    Types: Marijuana    Comment: daily     Review of Systems  Constitutional: No  fever/chills Eyes:  No discharge ENT: No upper respiratory complaints. Respiratory: no cough. No SOB/ use of accessory muscles to breath Gastrointestinal:   No nausea, no vomiting.  No diarrhea.  No constipation. Musculoskeletal: Patient has left wrist, left fingers and left forearm pain. Skin: Negative for rash, abrasions, lacerations, ecchymosis.    ____________________________________________   PHYSICAL EXAM:  VITAL SIGNS: ED Triage Vitals  Enc Vitals Group     BP 06/01/19 1725 (!) 150/98     Pulse Rate 06/01/19 1725 76     Resp 06/01/19 1725 18     Temp 06/01/19 1725 98 F (36.7 C)     Temp Source 06/01/19 1725 Oral     SpO2 06/01/19 1725 98 %     Weight 06/01/19 1726 132 lb (59.9 kg)     Height 06/01/19 1726 5\' 2"  (1.575 m)     Head Circumference --      Peak Flow --      Pain Score 06/01/19 1726 0     Pain Loc --      Pain Edu? --      Excl. in [place]? --      Constitutional: Alert and oriented. Well appearing and in no acute distress. Eyes: Conjunctivae are normal. PERRL. EOMI. Head: Atraumatic. Cardiovascular: Normal rate, regular rhythm. Normal S1 and S2.  Good peripheral circulation.  Respiratory: Normal respiratory effort without tachypnea or retractions. Lungs CTAB. Good air entry to the bases with no decreased or absent breath sounds Gastrointestinal: Bowel sounds x 4 quadrants. Soft and nontender to palpation. No guarding or rigidity. No distention. Musculoskeletal: Full range of motion to all extremities. No obvious deformities noted. Neurologic:  Normal for age. No gross focal neurologic deficits are appreciated.  Skin:  Skin is warm, dry and intact. No rash noted. Psychiatric: Mood and affect are normal for age. Speech and behavior are normal.   ____________________________________________   LABS (all labs ordered are listed, but only abnormal results are displayed)  Labs Reviewed - No data to  display ____________________________________________  EKG   ____________________________________________  RADIOLOGY Geraldo Pitter, personally viewed and evaluated these images (plain radiographs) as part of my medical decision making, as well as reviewing the written report by the radiologist.  DG Wrist Complete Left  Result Date: 06/01/2019 CLINICAL DATA:  Left wrist pain EXAM: LEFT WRIST - COMPLETE 3+ VIEW COMPARISON:  Hand radiograph 01/03/2018 FINDINGS: No acute displaced fracture or malalignment. Old fracture deformity of the distal fifth metacarpal. IMPRESSION: No acute osseous abnormality. Electronically Signed   By: Jasmine Pang M.D.   On: 06/01/2019 17:56    ____________________________________________    PROCEDURES  Procedure(s) performed:     Procedures     Medications - No data to display   ____________________________________________   INITIAL IMPRESSION / ASSESSMENT AND PLAN / ED COURSE  Pertinent labs & imaging results that were available during my care of the patient were reviewed by me and considered in my medical decision making (see chart for details).      Assessment and plan Cubital tunnel syndrome 34 year old male presents to the emergency department with left medial forearm pain and paresthesias of the left fourth and fifth fingers.  History and physical exam findings are consistent with cubital tunnel syndrome.  Patient was discharged with prednisone and advised to follow-up with orthopedics, Dr. Joice Lofts.  Return precautions were given.    ____________________________________________  FINAL CLINICAL IMPRESSION(S) / ED DIAGNOSES  Final diagnoses:  Cubital tunnel syndrome on left      NEW MEDICATIONS STARTED DURING THIS VISIT:  ED Discharge Orders         Ordered    predniSONE (STERAPRED UNI-PAK 21 TAB) 10 MG (21) TBPK tablet     06/01/19 1811              This chart was dictated using voice recognition  software/Dragon. Despite best efforts to proofread, errors can occur which can change the meaning. Any change was purely unintentional.     Orvil Feil, PA-C 06/01/19 1945    Dionne Bucy, MD 06/01/19 2002

## 2019-06-01 NOTE — ED Triage Notes (Signed)
States he felt a "pop" in left wrist about 2 weeks ago  States he was lifting something heavy  conts' to have weakness and some numbness

## 2019-06-22 ENCOUNTER — Other Ambulatory Visit: Payer: Self-pay

## 2019-06-22 ENCOUNTER — Encounter: Payer: Self-pay | Admitting: Emergency Medicine

## 2019-06-22 ENCOUNTER — Emergency Department
Admission: EM | Admit: 2019-06-22 | Discharge: 2019-06-22 | Disposition: A | Discharge status: Home / Self Care | Payer: No Typology Code available for payment source | Attending: Emergency Medicine | Admitting: Emergency Medicine

## 2019-06-22 DIAGNOSIS — F17210 Nicotine dependence, cigarettes, uncomplicated: Secondary | ICD-10-CM | POA: Diagnosis not present

## 2019-06-22 DIAGNOSIS — I10 Essential (primary) hypertension: Secondary | ICD-10-CM | POA: Insufficient documentation

## 2019-06-22 DIAGNOSIS — G5603 Carpal tunnel syndrome, bilateral upper limbs: Secondary | ICD-10-CM

## 2019-06-22 DIAGNOSIS — M25531 Pain in right wrist: Secondary | ICD-10-CM | POA: Diagnosis present

## 2019-06-22 MED ORDER — NAPROXEN 500 MG PO TABS: 500.0000 mg | ORAL_TABLET | Freq: Two times a day (BID) | ORAL | Status: DC

## 2019-06-22 NOTE — ED Triage Notes (Signed)
Pt seen here recently for cubital tunnel and reports that hand still numb after meds.  Pt did not FU with ortho.  Here for same sx but also having some tingling/numbness in other hand as well.

## 2019-06-22 NOTE — Discharge Instructions (Signed)
Advised to follow-up with orthopedics for definitive evaluation and treatment.

## 2019-06-22 NOTE — ED Provider Notes (Signed)
[facility] Emergency Department Provider Note  ____________________________________________   First MD Initiated Contact with Patient 06/22/19 1106     (approximate)  I have reviewed the triage vital signs and the nursing notes.   HISTORY  Chief Complaint Wrist Pain    HPI TRAIVON Miranda is a 34 y.o. male patient presents with bilateral wrist numbness. Patient was seen on 2019-06-01 for the same complaint affected on the left wrist. Patient was consulted orthopedics but did not comply. Patient state mild transient relief with Medrol Dosepak. Patient denies pain. Patient is right-hand dominant.         Past Medical History:  Diagnosis Date  . Hypertension     There are no problems to display for this patient.   History reviewed. No pertinent surgical history.  Prior to Admission medications   Medication Sig Start Date End Date Taking? Authorizing Provider  naproxen (NAPROSYN) 500 MG tablet Take 1 tablet (500 mg total) by mouth 2 (two) times daily with a meal. 06/22/19   Sable Feil, PA-C    Allergies Patient has no known allergies.  History reviewed. No pertinent family history.  Social History Social History   Tobacco Use  . Smoking status: Current Every Day Smoker    Packs/day: 0.50    Types: Cigarettes  . Smokeless tobacco: Never Used  Substance Use Topics  . Alcohol use: Yes    Comment: occ  . Drug use: Yes    Types: Marijuana    Comment: daily    Review of Systems Constitutional: No fever/chills Eyes: No visual changes. ENT: No sore throat. Cardiovascular: Denies chest pain. Respiratory: Denies shortness of breath. Gastrointestinal: No abdominal pain.  No nausea, no vomiting.  No diarrhea.  No constipation. Genitourinary: Negative for dysuria. Musculoskeletal: Negative for back pain. Skin: Negative for rash. Neurological: Bilateral numbness to the wrist. Endocrine:   Hypertension.  ____________________________________________   PHYSICAL EXAM:  VITAL SIGNS: ED Triage Vitals  Enc Vitals Group     BP 06/22/19 1041 (!) 142/91     Pulse Rate 06/22/19 1041 90     Resp 06/22/19 1041 18     Temp 06/22/19 1041 97.7 F (36.5 C)     Temp Source 06/22/19 1041 Oral     SpO2 06/22/19 1041 97 %     Weight 06/22/19 1036 132 lb (59.9 kg)     Height 06/22/19 1036 5\' 2"  (1.575 m)     Head Circumference --      Peak Flow --      Pain Score 06/22/19 1036 0     Pain Loc --      Pain Edu? --      Excl. in [place]? --    Constitutional: Alert and oriented. Well appearing and in no acute distress. Cardiovascular: Normal rate, regular rhythm. Grossly normal heart sounds.  Good peripheral circulation. Respiratory: Normal respiratory effort.  No retractions. Lungs CTAB. Musculoskeletal: No obvious deformities to bilateral wrists/pain. Patient full and equal range of motion.  Neurologic: Positive Tinel only in the left wrist.  Skin:  Skin is warm, dry and intact. No rash noted. No abrasion, ecchymosis, or edema. Psychiatric: Mood and affect are normal. Speech and behavior are normal.  ____________________________________________   LABS (all labs ordered are listed, but only abnormal results are displayed)  Labs Reviewed - No data to display ____________________________________________  EKG   ____________________________________________  RADIOLOGY  ED MD interpretation:    Official radiology report(s): No results found.  ____________________________________________   PROCEDURES  Procedure(s) performed (including Critical Care):  Procedures   ____________________________________________   INITIAL IMPRESSION / ASSESSMENT AND PLAN / ED COURSE  As part of my medical decision making, I reviewed the following data within the electronic MEDICAL RECORD NUMBER     Patient presents with bilateral wrist tingling and numbness. Patient complaint physical  finding consistent with carpal tunnel. Discussed rationale for follow-up orthopedic for definitive evaluation and treatment.    Oscar Miranda was evaluated in Emergency Department on 06/22/2019 for the symptoms described in the history of present illness. He was evaluated in the context of the global COVID-19 pandemic, which necessitated consideration that the patient might be at risk for infection with the SARS-CoV-2 virus that causes COVID-19. Institutional protocols and algorithms that pertain to the evaluation of patients at risk for COVID-19 are in a state of rapid change based on information released by regulatory bodies including the CDC and federal and state organizations. These policies and algorithms were followed during the patient's care in the ED.       ____________________________________________   FINAL CLINICAL IMPRESSION(S) / ED DIAGNOSES  Final diagnoses:  Carpal tunnel syndrome on both sides     ED Discharge Orders         Ordered    naproxen (NAPROSYN) 500 MG tablet  2 times daily with meals     06/22/19 1122           Note:  This document was prepared using Dragon voice recognition software and may include unintentional dictation errors.    Joni Reining, PA-C 06/22/19 1128    Concha Se, MD 06/23/19 [phone]

## 2019-07-17 ENCOUNTER — Other Ambulatory Visit: Payer: Self-pay

## 2019-07-17 ENCOUNTER — Emergency Department
Admission: EM | Admit: 2019-07-17 | Discharge: 2019-07-17 | Disposition: A | Discharge status: Home / Self Care | Payer: PRIVATE HEALTH INSURANCE | Attending: Emergency Medicine | Admitting: Emergency Medicine

## 2019-07-17 DIAGNOSIS — F17210 Nicotine dependence, cigarettes, uncomplicated: Secondary | ICD-10-CM | POA: Insufficient documentation

## 2019-07-17 DIAGNOSIS — R110 Nausea: Secondary | ICD-10-CM | POA: Diagnosis not present

## 2019-07-17 DIAGNOSIS — I10 Essential (primary) hypertension: Secondary | ICD-10-CM | POA: Diagnosis not present

## 2019-07-17 NOTE — ED Provider Notes (Signed)
Emergency Department Provider Note  ____________________________________________  Time seen: Approximately 6:08 PM  I have reviewed the triage vital signs and the nursing notes.   HISTORY  Chief Complaint Nausea   Historian Patient     HPI Oscar Miranda is a 34 y.o. male presents to the emergency department for a return to work evaluation.  Patient states that he had nausea and vomiting for several days and his symptoms have resolved.  Patient states that he would like to return to work.  No fever or chills.  He denies chest pain, chest tightness or abdominal pain.  No other alleviating measures have been attempted.   Past Medical History:  Diagnosis Date  . Hypertension      Immunizations up to date:  Yes.     Past Medical History:  Diagnosis Date  . Hypertension     There are no problems to display for this patient.   History reviewed. No pertinent surgical history.  Prior to Admission medications   Not on File    Allergies Patient has no known allergies.  No family history on file.  Social History Social History   Tobacco Use  . Smoking status: Current Every Day Smoker    Packs/day: 0.50    Types: Cigarettes  . Smokeless tobacco: Never Used  Substance Use Topics  . Alcohol use: Yes    Comment: occ  . Drug use: Yes    Types: Marijuana    Comment: daily     Review of Systems  Constitutional: No fever/chills Eyes:  No discharge ENT: No upper respiratory complaints. Respiratory: no cough. No SOB/ use of accessory muscles to breath Gastrointestinal:  Patient has nausea.  Musculoskeletal: Negative for musculoskeletal pain. Skin: Negative for rash, abrasions, lacerations, ecchymosis.    ____________________________________________   PHYSICAL EXAM:  VITAL SIGNS: ED Triage Vitals  Enc Vitals Group     BP 07/17/19 1602 (!) 155/107     Pulse Rate 07/17/19 1602 75     Resp 07/17/19 1602 18     Temp 07/17/19 1602 98.5 F (36.9 C)   Temp src --      SpO2 07/17/19 1602 100 %     Weight 07/17/19 1603 132 lb (59.9 kg)     Height 07/17/19 1603 5\' 2"  (1.575 m)     Head Circumference --      Peak Flow --      Pain Score 07/17/19 1602 0     Pain Loc --      Pain Edu? --      Excl. in [place]? --      Constitutional: Alert and oriented. Well appearing and in no acute distress. Eyes: Conjunctivae are normal. PERRL. EOMI. Head: Atraumatic. Cardiovascular: Normal rate, regular rhythm. Normal S1 and S2.  Good peripheral circulation. Respiratory: Normal respiratory effort without tachypnea or retractions. Lungs CTAB. Good air entry to the bases with no decreased or absent breath sounds Gastrointestinal: Bowel sounds x 4 quadrants. Soft and nontender to palpation. No guarding or rigidity. No distention. Musculoskeletal: Full range of motion to all extremities. No obvious deformities noted Neurologic:  Normal for age. No gross focal neurologic deficits are appreciated.  Skin:  Skin is warm, dry and intact. No rash noted. Psychiatric: Mood and affect are normal for age. Speech and behavior are normal.   ____________________________________________   LABS (all labs ordered are listed, but only abnormal results are displayed)  Labs Reviewed - No data to display ____________________________________________  EKG   ____________________________________________  RADIOLOGY   No results found.  ____________________________________________    PROCEDURES  Procedure(s) performed:     Procedures     Medications - No data to display   ____________________________________________   INITIAL IMPRESSION / ASSESSMENT AND PLAN / ED COURSE  Pertinent labs & imaging results that were available during my care of the patient were reviewed by me and considered in my medical decision making (see chart for details).      Assessment and plan Nausea 34 year old male presents to the emergency department after he experienced  nausea for several days.  Symptoms resolved and patient would like a return to work note.  Request was granted.   ____________________________________________  FINAL CLINICAL IMPRESSION(S) / ED DIAGNOSES  Final diagnoses:  Nausea      NEW MEDICATIONS STARTED DURING THIS VISIT:  ED Discharge Orders    None          This chart was dictated using voice recognition software/Dragon. Despite best efforts to proofread, errors can occur which can change the meaning. Any change was purely unintentional.     Lannie Fields, PA-C 07/17/19 1810    Arta Silence, MD 07/20/19 [phone]

## 2019-07-17 NOTE — ED Notes (Signed)
See triage note  Presents with nausea for couple of days  States he had some vomiting on Friday  But only nausea since

## 2019-07-17 NOTE — ED Triage Notes (Signed)
Pt comes via POV from home with c/o nausea for last few days. Pt states he basically just needs a doctors note.  Pt denies any other symptoms.

## 2019-11-15 ENCOUNTER — Encounter: Payer: Self-pay | Admitting: Emergency Medicine

## 2019-11-15 ENCOUNTER — Other Ambulatory Visit: Payer: Self-pay

## 2019-11-15 ENCOUNTER — Emergency Department
Admission: EM | Admit: 2019-11-15 | Discharge: 2019-11-15 | Disposition: A | Discharge status: Home / Self Care | Payer: PRIVATE HEALTH INSURANCE | Attending: Emergency Medicine | Admitting: Emergency Medicine

## 2019-11-15 DIAGNOSIS — I10 Essential (primary) hypertension: Secondary | ICD-10-CM | POA: Insufficient documentation

## 2019-11-15 DIAGNOSIS — M25511 Pain in right shoulder: Secondary | ICD-10-CM | POA: Insufficient documentation

## 2019-11-15 DIAGNOSIS — R519 Headache, unspecified: Secondary | ICD-10-CM | POA: Insufficient documentation

## 2019-11-15 DIAGNOSIS — F17210 Nicotine dependence, cigarettes, uncomplicated: Secondary | ICD-10-CM | POA: Insufficient documentation

## 2019-11-15 MED ORDER — METOCLOPRAMIDE HCL 10 MG PO TABS
10.0000 mg | ORAL_TABLET | Freq: Once | ORAL | Status: AC
  Administered 2019-11-15: 10 mg via ORAL
  Filled 2019-11-15: qty 1

## 2019-11-15 MED ORDER — KETOROLAC TROMETHAMINE 30 MG/ML IJ SOLN
30.0000 mg | Freq: Once | INTRAMUSCULAR | Status: AC
  Administered 2019-11-15: 30 mg via INTRAMUSCULAR
  Filled 2019-11-15: qty 1

## 2019-11-15 MED ORDER — KETOROLAC TROMETHAMINE 10 MG PO TABS: 10.0000 mg | ORAL_TABLET | Freq: Four times a day (QID) | ORAL | 0 refills | Status: AC | PRN

## 2019-11-15 MED ORDER — DIPHENHYDRAMINE HCL 25 MG PO CAPS
25.0000 mg | ORAL_CAPSULE | Freq: Once | ORAL | Status: AC
  Administered 2019-11-15: 25 mg via ORAL
  Filled 2019-11-15: qty 1

## 2019-11-15 NOTE — ED Triage Notes (Addendum)
Patient presents to the ED with right shoulder pain x 3 weeks and headache x 3 days.  Patient reports history of migraines and states headache feels the same.  Patient states, "It's been so bad, I had to call out of work, so I really just need a work note."  Patient is in no obvious distress at this time.  Patient states headache is much better today.

## 2019-11-15 NOTE — ED Notes (Signed)
See triage note  Presents with shoulder pain and h/a  States he started sx's about 3 weeks ago  No fever or trauma   States he has been out work  B/c of this

## 2019-11-15 NOTE — ED Provider Notes (Signed)
Emergency Department Provider Note  ____________________________________________  Time seen: Approximately 3:10 PM  I have reviewed the triage vital signs and the nursing notes.   HISTORY  Chief Complaint Shoulder Pain and Headache   Historian Patient     HPI Oscar Miranda is a 33 y.o. male with a history of hypertension and migraines, presents to the emergency department with muscle spasms along the right upper trapezius and occipital headache for the past 3 days.  Patient states that he has a history of headaches and his current symptoms feel similar.  He denies photophobia and phonophobia.  He endorses gradual progression of headache intensity.  He denies falls or mechanisms of trauma.  He has been afebrile at home.  He denies changes in vision.  No weakness in the upper and lower extremities.   Past Medical History:  Diagnosis Date  . Hypertension      Immunizations up to date:  Yes.     Past Medical History:  Diagnosis Date  . Hypertension     There are no problems to display for this patient.   History reviewed. No pertinent surgical history.  Prior to Admission medications   Medication Sig Start Date End Date Taking? Authorizing Provider  ketorolac (TORADOL) 10 MG tablet Take 1 tablet (10 mg total) by mouth every 6 (six) hours as needed for up to 5 days. 11/15/19 11/20/19  Orvil Feil, PA-C    Allergies Patient has no known allergies.  No family history on file.  Social History Social History   Tobacco Use  . Smoking status: Current Every Day Smoker    Packs/day: 0.50    Types: Cigarettes  . Smokeless tobacco: Never Used  Vaping Use  . Vaping Use: Never used  Substance Use Topics  . Alcohol use: Yes    Comment: occ  . Drug use: Yes    Types: Marijuana    Comment: daily     Review of Systems  Constitutional: No fever/chills Eyes:  No discharge ENT: No upper respiratory complaints. Respiratory: no cough. No SOB/ use of accessory  muscles to breath Gastrointestinal:   No nausea, no vomiting.  No diarrhea.  No constipation. Musculoskeletal: Negative for musculoskeletal pain. Neuro: Patient has headache.  Skin: Negative for rash, abrasions, lacerations, ecchymosis.    ____________________________________________   PHYSICAL EXAM:  VITAL SIGNS: ED Triage Vitals  Enc Vitals Group     BP 11/15/19 1413 (S) (!) 172/109     Pulse Rate 11/15/19 1413 (!) 105     Resp 11/15/19 1413 18     Temp 11/15/19 1413 98.5 F (36.9 C)     Temp Source 11/15/19 1413 Oral     SpO2 11/15/19 1413 98 %     Weight 11/15/19 1410 132 lb (59.9 kg)     Height 11/15/19 1410 5\' 2"  (1.575 m)     Head Circumference --      Peak Flow --      Pain Score 11/15/19 1410 5     Pain Loc --      Pain Edu? --      Excl. in [place]? --      Constitutional: Alert and oriented. Well appearing and in no acute distress. Eyes: Conjunctivae are normal. PERRL. EOMI. Head: Atraumatic. ENT:      Ears: TMs are pearly.       Nose: No congestion/rhinnorhea.      Mouth/Throat: Mucous membranes are moist.  Neck: No stridor.  Full range of motion.  No radicular symptoms are elicited with range of motion testing at the neck. Cardiovascular: Normal rate, regular rhythm. Normal S1 and S2.  Good peripheral circulation. Respiratory: Normal respiratory effort without tachypnea or retractions. Lungs CTAB. Good air entry to the bases with no decreased or absent breath sounds Gastrointestinal: Bowel sounds x 4 quadrants. Soft and nontender to palpation. No guarding or rigidity. No distention. Musculoskeletal: Full range of motion to all extremities. No obvious deformities noted Neurologic:  Normal for age. No gross focal neurologic deficits are appreciated.  Skin:  Skin is warm, dry and intact. No rash noted. Psychiatric: Mood and affect are normal for age. Speech and behavior are normal.   ____________________________________________   LABS (all labs ordered are  listed, but only abnormal results are displayed)  Labs Reviewed - No data to display ____________________________________________  EKG   ____________________________________________  RADIOLOGY  No results found.  ____________________________________________    PROCEDURES  Procedure(s) performed:     Procedures     Medications  ketorolac (TORADOL) 30 MG/ML injection 30 mg (30 mg Intramuscular Given 11/15/19 1515)  metoCLOPramide (REGLAN) tablet 10 mg (10 mg Oral Given 11/15/19 1515)  diphenhydrAMINE (BENADRYL) capsule 25 mg (25 mg Oral Given 11/15/19 1515)     ____________________________________________   INITIAL IMPRESSION / ASSESSMENT AND PLAN / ED COURSE  Pertinent labs & imaging results that were available during my care of the patient were reviewed by me and considered in my medical decision making (see chart for details).      Assessment and plan Headache 34 year old male presents to the emergency department with persistent headache for the past 3 days.  Patient was hypertensive and mildly tachycardic at triage.  Neuro exam was without acute deficits.  Will administer Toradol, Benadryl and Reglan and will reassess.  Patient stated that his headache improved significantly. Will discharge patient home with oral Toradol. Return precautions were given to return with new or worsening symptoms.    ____________________________________________  FINAL CLINICAL IMPRESSION(S) / ED DIAGNOSES  Final diagnoses:  Acute pain of right shoulder  Acute nonintractable headache, unspecified headache type      NEW MEDICATIONS STARTED DURING THIS VISIT:  ED Discharge Orders         Ordered    ketorolac (TORADOL) 10 MG tablet  Every 6 hours PRN        11/15/19 1604              This chart was dictated using voice recognition software/Dragon. Despite best efforts to proofread, errors can occur which can change the meaning. Any change was purely  unintentional.     Gasper Lloyd 11/15/19 2057    Shaune Pollack, MD 11/20/19 [phone]

## 2019-12-29 ENCOUNTER — Emergency Department
Admission: EM | Admit: 2019-12-29 | Discharge: 2019-12-29 | Disposition: A | Discharge status: Home / Self Care | Payer: BC Managed Care – PPO | Attending: Emergency Medicine | Admitting: Emergency Medicine

## 2019-12-29 ENCOUNTER — Encounter: Payer: Self-pay | Admitting: Emergency Medicine

## 2019-12-29 ENCOUNTER — Other Ambulatory Visit: Payer: Self-pay

## 2019-12-29 DIAGNOSIS — J069 Acute upper respiratory infection, unspecified: Secondary | ICD-10-CM | POA: Diagnosis not present

## 2019-12-29 DIAGNOSIS — I10 Essential (primary) hypertension: Secondary | ICD-10-CM | POA: Diagnosis not present

## 2019-12-29 DIAGNOSIS — R5383 Other fatigue: Secondary | ICD-10-CM | POA: Insufficient documentation

## 2019-12-29 DIAGNOSIS — F17210 Nicotine dependence, cigarettes, uncomplicated: Secondary | ICD-10-CM | POA: Insufficient documentation

## 2019-12-29 DIAGNOSIS — R059 Cough, unspecified: Secondary | ICD-10-CM | POA: Diagnosis present

## 2019-12-29 MED ORDER — PSEUDOEPH-BROMPHEN-DM 30-2-10 MG/5ML PO SYRP: 5.0000 mL | ORAL_SOLUTION | Freq: Four times a day (QID) | ORAL | 0 refills | Status: AC | PRN

## 2019-12-29 NOTE — ED Triage Notes (Signed)
Pt to ED via POV, pt states that he has been sick the past few days, Pt had neg covid test, pt needs note to return to work. Pt is in NAD.

## 2019-12-29 NOTE — Discharge Instructions (Signed)
Follow discharge care instruction take medication as needed. 

## 2019-12-29 NOTE — ED Provider Notes (Signed)
[facility] Emergency Department Provider Note   ____________________________________________   First MD Initiated Contact with Patient 12/29/19 1451     (approximate)  I have reviewed the triage vital signs and the nursing notes.   HISTORY  Chief Complaint work note    HPI Oscar Miranda is a 34 y.o. male patient presents with fatigue and cough.  Patient state he has been sick for a few days.  Patient stated he tested negative for COVID-19 and he needs a note to return to work.  Patient has not taken the vaccine.         Past Medical History:  Diagnosis Date  . Hypertension     There are no problems to display for this patient.   History reviewed. No pertinent surgical history.  Prior to Admission medications   Medication Sig Start Date End Date Taking? Authorizing Provider  brompheniramine-pseudoephedrine-DM 30-2-10 MG/5ML syrup Take 5 mLs by mouth 4 (four) times daily as needed. 12/29/19   Joni Reining, PA-C    Allergies Patient has no known allergies.  No family history on file.  Social History Social History   Tobacco Use  . Smoking status: Current Every Day Smoker    Packs/day: 0.50    Types: Cigarettes  . Smokeless tobacco: Never Used  Vaping Use  . Vaping Use: Never used  Substance Use Topics  . Alcohol use: Yes    Comment: occ  . Drug use: Yes    Types: Marijuana    Comment: daily    Review of Systems Constitutional: No fever/chills Eyes: No visual changes. ENT: No sore throat. Cardiovascular: Denies chest pain. Respiratory: Denies shortness of breath.  Nonproductive cough Gastrointestinal: No abdominal pain.  No nausea, no vomiting.  No diarrhea.  No constipation. Genitourinary: Negative for dysuria. Musculoskeletal: Negative for back pain. Skin: Negative for rash. Neurological: Negative for headaches, focal weakness or numbness. Endocrine:  Hypertension Hematological/Lymphatic:  Allergic/Immunilogical:  **}  ____________________________________________   PHYSICAL EXAM:  VITAL SIGNS: ED Triage Vitals  Enc Vitals Group     BP 12/29/19 1325 (!) 157/108     Pulse Rate 12/29/19 1325 73     Resp 12/29/19 1325 16     Temp 12/29/19 1325 98.6 F (37 C)     Temp Source 12/29/19 1325 Oral     SpO2 12/29/19 1325 98 %     Weight 12/29/19 1323 130 lb (59 kg)     Height 12/29/19 1323 5\' 2"  (1.575 m)     Head Circumference --      Peak Flow --      Pain Score 12/29/19 1323 0     Pain Loc --      Pain Edu? --      Excl. in GC? --    Constitutional: Alert and oriented. Well appearing and in no acute distress. Nose: No congestion/rhinnorhea. Mouth/Throat: Mucous membranes are moist.  Oropharynx non-erythematous.  Postnasal drainage. Neck: No stridor.   Hematological/Lymphatic/Immunilogical: No cervical lymphadenopathy. Cardiovascular: Normal rate, regular rhythm. Grossly normal heart sounds.  Good peripheral circulation.  Elevated blood pressure Respiratory: Normal respiratory effort.  No retractions. Lungs CTAB. Skin:  Skin is warm, dry and intact. No rash noted. Psychiatric: Mood and affect are normal. Speech and behavior are normal.  ____________________________________________   LABS (all labs ordered are listed, but only abnormal results are displayed)  Labs Reviewed - No data to display ____________________________________________  EKG   ____________________________________________  RADIOLOGY 02/28/20, personally  viewed and evaluated these images (plain radiographs) as part of my medical decision making, as well as reviewing the written report by the radiologist.  ED MD interpretation:    Official radiology report(s): No results found.  ____________________________________________   PROCEDURES  Procedure(s) performed (including Critical Care):  Procedures   ____________________________________________   INITIAL IMPRESSION / ASSESSMENT AND PLAN / ED  COURSE  As part of my medical decision making, I reviewed the following data within the electronic MEDICAL RECORD NUMBER     Patient presents with cough and fatigue.  Patient requested a work note since he has a negative COVID-19 test results.  Patient complaint physical exam consistent with viral respiratory infection with cough.  Patient given discharge care instructions, prescription for Bromfed-DM, and a return to work note.  Patient advised [facility].          ____________________________________________   FINAL CLINICAL IMPRESSION(S) / ED DIAGNOSES  Final diagnoses:  Viral upper respiratory tract infection with cough     ED Discharge Orders         Ordered    brompheniramine-pseudoephedrine-DM 30-2-10 MG/5ML syrup  4 times daily PRN        12/29/19 1454          *Please note:  Oscar Miranda was evaluated in Emergency Department on 12/29/2019 for the symptoms described in the history of present illness. He was evaluated in the context of the global COVID-19 pandemic, which necessitated consideration that the patient might be at risk for infection with the SARS-CoV-2 virus that causes COVID-19. Institutional protocols and algorithms that pertain to the evaluation of patients at risk for COVID-19 are in a state of rapid change based on information released by regulatory bodies including the CDC and federal and state organizations. These policies and algorithms were followed during the patient's care in the ED.  Some ED evaluations and interventions may be delayed as a result of limited staffing during and the pandemic.*   Note:  This document was prepared using Dragon voice recognition software and may include unintentional dictation errors.    Joni Reining, PA-C 12/29/19 1500    Jene Every, MD 12/29/19 1526

## 2020-04-23 ENCOUNTER — Emergency Department
Admission: EM | Admit: 2020-04-23 | Discharge: 2020-04-23 | Disposition: A | Discharge status: Home / Self Care | Payer: BC Managed Care – PPO | Attending: Emergency Medicine | Admitting: Emergency Medicine

## 2020-04-23 ENCOUNTER — Other Ambulatory Visit: Payer: Self-pay

## 2020-04-23 DIAGNOSIS — M545 Low back pain, unspecified: Secondary | ICD-10-CM | POA: Diagnosis present

## 2020-04-23 DIAGNOSIS — Y990 Civilian activity done for income or pay: Secondary | ICD-10-CM | POA: Diagnosis not present

## 2020-04-23 DIAGNOSIS — F17210 Nicotine dependence, cigarettes, uncomplicated: Secondary | ICD-10-CM | POA: Diagnosis not present

## 2020-04-23 DIAGNOSIS — X500XXA Overexertion from strenuous movement or load, initial encounter: Secondary | ICD-10-CM | POA: Insufficient documentation

## 2020-04-23 DIAGNOSIS — I10 Essential (primary) hypertension: Secondary | ICD-10-CM | POA: Insufficient documentation

## 2020-04-23 DIAGNOSIS — Z0279 Encounter for issue of other medical certificate: Secondary | ICD-10-CM | POA: Insufficient documentation

## 2020-04-23 MED ORDER — MELOXICAM 15 MG PO TABS: 15.0000 mg | ORAL_TABLET | Freq: Every day | ORAL | 0 refills | Status: AC

## 2020-04-23 MED ORDER — METHOCARBAMOL 500 MG PO TABS: 500.0000 mg | ORAL_TABLET | Freq: Four times a day (QID) | ORAL | 0 refills | Status: AC

## 2020-04-23 NOTE — ED Triage Notes (Signed)
Left work today for back pain and states needs a work note

## 2020-04-23 NOTE — ED Provider Notes (Signed)
[facility] Emergency Department Provider Note  ____________________________________________  Time seen: Approximately 7:44 PM  I have reviewed the triage vital signs and the nursing notes.   HISTORY  Chief Complaint Back Pain and work note    HPI Oscar Miranda is a 35 y.o. male who presents the emergency department complaining of right lower back pain.  Patient states that he has some chronic issues with his lower back.  No recent trauma.  No radicular symptoms.  He states that he is here primarily for a work note as he had to call out of work secondary to his back pain.  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  No direct trauma.  No urinary symptoms.  No GI complaints.  Patient is here for work note only         Past Medical History:  Diagnosis Date  . Hypertension     There are no problems to display for this patient.   History reviewed. No pertinent surgical history.  Prior to Admission medications   Medication Sig Start Date End Date Taking? Authorizing Provider  meloxicam (MOBIC) 15 MG tablet Take 1 tablet (15 mg total) by mouth daily. 04/23/20  Yes Raydell Maners, Delorise Royals, PA-C  methocarbamol (ROBAXIN) 500 MG tablet Take 1 tablet (500 mg total) by mouth 4 (four) times daily. 04/23/20  Yes Shalva Rozycki, Delorise Royals, PA-C  brompheniramine-pseudoephedrine-DM 30-2-10 MG/5ML syrup Take 5 mLs by mouth 4 (four) times daily as needed. 12/29/19   Joni Reining, PA-C    Allergies Patient has no known allergies.  History reviewed. No pertinent family history.  Social History Social History   Tobacco Use  . Smoking status: Current Every Day Smoker    Packs/day: 0.50    Types: Cigarettes  . Smokeless tobacco: Never Used  Vaping Use  . Vaping Use: Never used  Substance Use Topics  . Alcohol use: Yes    Comment: occ  . Drug use: Yes    Types: Marijuana    Comment: daily     Review of Systems  Constitutional: No  fever/chills Eyes: No visual changes. No discharge ENT: No upper respiratory complaints. Cardiovascular: no chest pain. Respiratory: no cough. No SOB. Gastrointestinal: No abdominal pain.  No nausea, no vomiting.  No diarrhea.  No constipation. Genitourinary: Negative for dysuria. No hematuria Musculoskeletal: Positive for right lower back pain Skin: Negative for rash, abrasions, lacerations, ecchymosis. Neurological: Negative for headaches, focal weakness or numbness.  10 System ROS otherwise negative.  ____________________________________________   PHYSICAL EXAM:  VITAL SIGNS: ED Triage Vitals  Enc Vitals Group     BP 04/23/20 1844 (!) 155/103     Pulse Rate 04/23/20 1844 (!) 102     Resp 04/23/20 1844 (!) 23     Temp 04/23/20 1844 98.4 F (36.9 C)     Temp Source 04/23/20 1844 Oral     SpO2 --      Weight 04/23/20 1841 130 lb (59 kg)     Height 04/23/20 1841 5\' 2"  (1.575 m)     Head Circumference --      Peak Flow --      Pain Score 04/23/20 1841 8     Pain Loc --      Pain Edu? --      Excl. in GC? --      Constitutional: Alert and oriented. Well appearing and in no acute distress. Eyes: Conjunctivae are normal. PERRL. EOMI. Head: Atraumatic. ENT:  Ears:       Nose: No congestion/rhinnorhea.      Mouth/Throat: Mucous membranes are moist.  Neck: No stridor.    Cardiovascular: Normal rate, regular rhythm. Normal S1 and S2.  Good peripheral circulation. Respiratory: Normal respiratory effort without tachypnea or retractions. Lungs CTAB. Good air entry to the bases with no decreased or absent breath sounds. Gastrointestinal: Bowel sounds 4 quadrants. Soft and nontender to palpation. No guarding or rigidity. No palpable masses. No distention. No CVA tenderness. Musculoskeletal: Full range of motion to all extremities. No gross deformities appreciated.  No visible signs of trauma to the lower back.  Good range of motion.  Patient is nontender midline and left  paraspinal muscle group.  Mild tenderness in the superior aspect of the lumbar spine and the paraspinal muscle group.  No palpable abnormality.  No extension into the SI joint or sciatic notch.  Dorsalis pedis pulse and sensation intact and equal bilateral lower extremities. Neurologic:  Normal speech and language. No gross focal neurologic deficits are appreciated.  Skin:  Skin is warm, dry and intact. No rash noted. Psychiatric: Mood and affect are normal. Speech and behavior are normal. Patient exhibits appropriate insight and judgement.   ____________________________________________   LABS (all labs ordered are listed, but only abnormal results are displayed)  Labs Reviewed - No data to display ____________________________________________  EKG   ____________________________________________  RADIOLOGY   No results found.  ____________________________________________    PROCEDURES  Procedure(s) performed:    Procedures    Medications - No data to display   ____________________________________________   INITIAL IMPRESSION / ASSESSMENT AND PLAN / ED COURSE  Pertinent labs & imaging results that were available during my care of the patient were reviewed by me and considered in my medical decision making (see chart for details).  Review of the [place] CSRS was performed in accordance of the [facility] prior to dispensing any controlled drugs.           Patient's diagnosis is consistent with acute low back pain.  Patient presented to the emergency department complaining of lower back pain.  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  He had to call out of work today and needed a work note.  No concerning findings on physical exam.  No indication for imaging.  Patient will have meloxicam and Robaxin for symptom relief.  Work note provided to the patient.  Follow-up primary care as needed. Patient is given ED precautions to return to the ED for any worsening or new  symptoms.     ____________________________________________  FINAL CLINICAL IMPRESSION(S) / ED DIAGNOSES  Final diagnoses:  Acute right-sided low back pain without sciatica      NEW MEDICATIONS STARTED DURING THIS VISIT:  ED Discharge Orders         Ordered    meloxicam (MOBIC) 15 MG tablet  Daily        04/23/20 1945    methocarbamol (ROBAXIN) 500 MG tablet  4 times daily        04/23/20 1945              This chart was dictated using voice recognition software/Dragon. Despite best efforts to proofread, errors can occur which can change the meaning. Any change was purely unintentional.    Racheal Patches, PA-C 04/23/20 Fredrik Rigger, MD 04/23/20 [phone]

## 2020-05-05 ENCOUNTER — Emergency Department
Admission: EM | Admit: 2020-05-05 | Discharge: 2020-05-05 | Discharge status: Against Medical Advice | Payer: BC Managed Care – PPO | Attending: Emergency Medicine | Admitting: Emergency Medicine

## 2020-05-05 ENCOUNTER — Other Ambulatory Visit: Payer: Self-pay

## 2020-05-05 ENCOUNTER — Encounter: Payer: Self-pay | Admitting: Emergency Medicine

## 2020-05-05 DIAGNOSIS — I10 Essential (primary) hypertension: Secondary | ICD-10-CM | POA: Insufficient documentation

## 2020-05-05 DIAGNOSIS — F17210 Nicotine dependence, cigarettes, uncomplicated: Secondary | ICD-10-CM | POA: Insufficient documentation

## 2020-05-05 DIAGNOSIS — K529 Noninfective gastroenteritis and colitis, unspecified: Secondary | ICD-10-CM

## 2020-05-05 DIAGNOSIS — R109 Unspecified abdominal pain: Secondary | ICD-10-CM | POA: Diagnosis present

## 2020-05-05 NOTE — ED Provider Notes (Signed)
[facility] Emergency Department Provider Note  ____________________________________________   Event Date/Time   First MD Initiated Contact with Patient 05/05/20 1748     (approximate)  I have reviewed the triage vital signs and the nursing notes.   HISTORY  Chief Complaint work note   HPI Oscar Miranda is a 35 y.o. male with a past medical history of hypertension who presents for assessment of proximately 3 days of some crampy abdominal pain associate with nonbloody diarrhea and was initially associate with some nonbloody nonbilious vomitus on the first day.  Patient states his nausea vomiting has significantly improved as this is Abdominal pain but he still has some diarrhea.  He denies any headache or earache, sore throat, chest pain, cough, shortness of breath, fevers, chills, urinary symptoms, back pain, rash or extremity pain.  No recent falls or injuries.  He does endorse drinking several beers per day but denies any illicit drug use.  No clearly fitting aggravating factors.  No prior similar episodes.  He states he only wants to be seen for a work note and does not want anything else done today.         Past Medical History:  Diagnosis Date  . Hypertension     There are no problems to display for this patient.   No past surgical history on file.  Prior to Admission medications   Medication Sig Start Date End Date Taking? Authorizing Provider  brompheniramine-pseudoephedrine-DM 30-2-10 MG/5ML syrup Take 5 mLs by mouth 4 (four) times daily as needed. 12/29/19   Joni Reining, PA-C  meloxicam (MOBIC) 15 MG tablet Take 1 tablet (15 mg total) by mouth daily. 04/23/20   Cuthriell, Delorise Royals, PA-C  methocarbamol (ROBAXIN) 500 MG tablet Take 1 tablet (500 mg total) by mouth 4 (four) times daily. 04/23/20   Cuthriell, Delorise Royals, PA-C    Allergies Patient has no known allergies.  No family history on file.  Social History Social History   Tobacco  Use  . Smoking status: Current Every Day Smoker    Packs/day: 0.50    Types: Cigarettes  . Smokeless tobacco: Never Used  Vaping Use  . Vaping Use: Never used  Substance Use Topics  . Alcohol use: Yes    Comment: occ  . Drug use: Yes    Types: Marijuana    Comment: daily    Review of Systems  Review of Systems  Constitutional: Negative for chills and fever.  HENT: Negative for sore throat.   Eyes: Negative for pain.  Respiratory: Negative for cough and stridor.   Cardiovascular: Negative for chest pain.  Gastrointestinal: Positive for abdominal pain, diarrhea, nausea and vomiting.  Genitourinary: Negative for dysuria.  Musculoskeletal: Negative for myalgias.  Skin: Negative for rash.  Neurological: Negative for seizures, loss of consciousness and headaches.  Psychiatric/Behavioral: Negative for suicidal ideas.  All other systems reviewed and are negative.     ____________________________________________   PHYSICAL EXAM:  VITAL SIGNS: ED Triage Vitals  Enc Vitals Group     BP 05/05/20 1724 (!) 141/98     Pulse Rate 05/05/20 1724 69     Resp 05/05/20 1724 14     Temp 05/05/20 1724 98.8 F (37.1 C)     Temp Source 05/05/20 1724 Oral     SpO2 05/05/20 1724 98 %     Weight 05/05/20 1654 130 lb 1.1 oz (59 kg)     Height 05/05/20 1654 5\' 2"  (1.575 m)  Head Circumference --      Peak Flow --      Pain Score 05/05/20 1654 0     Pain Loc --      Pain Edu? --      Excl. in GC? --    Vitals:   05/05/20 1724  BP: (!) 141/98  Pulse: 69  Resp: 14  Temp: 98.8 F (37.1 C)  SpO2: 98%   Physical Exam Vitals and nursing note reviewed.  Constitutional:      Appearance: He is well-developed and well-nourished.  HENT:     Head: Normocephalic and atraumatic.     Right Ear: External ear normal.     Left Ear: External ear normal.     Nose: Nose normal.  Eyes:     Conjunctiva/sclera: Conjunctivae normal.  Cardiovascular:     Rate and Rhythm: Normal rate and  regular rhythm.     Heart sounds: No murmur heard.   Pulmonary:     Effort: Pulmonary effort is normal. No respiratory distress.     Breath sounds: Normal breath sounds.  Abdominal:     Palpations: Abdomen is soft.     Tenderness: There is no abdominal tenderness.  Musculoskeletal:        General: No edema.     Cervical back: Neck supple.  Skin:    General: Skin is warm and dry.  Neurological:     Mental Status: He is alert and oriented to person, place, and time.  Psychiatric:        Mood and Affect: Mood and affect and mood normal.      ____________________________________________   LABS (all labs ordered are listed, but only abnormal results are displayed)  Labs Reviewed - No data to display ____________________________________________  EKG  ____________________________________________  RADIOLOGY  ED MD interpretation:   Official radiology report(s): No results found.  ____________________________________________   PROCEDURES  Procedure(s) performed (including Critical Care):  Procedures   ____________________________________________   INITIAL IMPRESSION / ASSESSMENT AND PLAN / ED COURSE        Patient presents with above to history exam for assessment of some crampy abdominal pain associate with nausea and vomiting over the last couple of days.  Seems his nausea and vomiting is significantly improved but he has some persistent diarrhea.  On arrival he is slightly upper tensive with a BP of 141/98 with stable vital signs on arrival.  Impression is likely acute infectious gastroenteritis however additional differentials include cryptitis, appendicitis, cholecystitis, diverticulitis and kidney stone.  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  Advised him that he  should stay to work as [employer] as he is having infectious symptoms and return immediately to the emergency room if he declines or changes his mind but I was unable to convince him to stay for additional evaluation.  He was discharged against my advice I believe keep capacity make this decision.  Discharged stable condition.     ____________________________________________   FINAL CLINICAL IMPRESSION(S) / ED DIAGNOSES  Final diagnoses:  Gastroenteritis    Medications - No data to display   ED Discharge Orders    None       Note:  This document was prepared using Dragon voice recognition software and may include unintentional dictation errors.   Gilles Chiquito, MD 05/05/20 [phone]

## 2020-05-05 NOTE — ED Triage Notes (Signed)
Arrives to ED today for a work note   States he was sick with vomiting on Friday and then diarrhea over the weekend.  Does not want to be seen, just a work note.  AAOX3.  Skin warm and dry. NAD

## 2020-05-05 NOTE — ED Notes (Signed)
AAOx3.  Skin warm and dry.  NAD
